# Patient Record
Sex: Female | Born: 2000 | Race: Black or African American | Hispanic: No | Marital: Single | Smoking: Never smoker
Health system: Southern US, Community
[De-identification: ages and names within clinical notes are randomized; demographics above are authoritative.]

## PROBLEM LIST (undated history)

## (undated) ENCOUNTER — Ambulatory Visit: Source: Home / Self Care

## (undated) DIAGNOSIS — G8929 Other chronic pain: Secondary | ICD-10-CM

## (undated) DIAGNOSIS — R519 Headache, unspecified: Secondary | ICD-10-CM

## (undated) DIAGNOSIS — Z789 Other specified health status: Secondary | ICD-10-CM

## (undated) HISTORY — DX: Headache, unspecified: R51.9

## (undated) HISTORY — PX: WISDOM TOOTH EXTRACTION: SHX21

## (undated) HISTORY — DX: Other specified health status: Z78.9

## (undated) HISTORY — DX: Other chronic pain: G89.29

---
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
## 2021-06-03 ENCOUNTER — Encounter (HOSPITAL_COMMUNITY): Payer: Self-pay

## 2021-06-03 ENCOUNTER — Emergency Department (HOSPITAL_COMMUNITY)
Admission: EM | Admit: 2021-06-03 | Discharge: 2021-06-04 | Disposition: A | Discharge status: Home / Self Care | Payer: Medicaid Other | Attending: Emergency Medicine | Admitting: Emergency Medicine

## 2021-06-03 ENCOUNTER — Other Ambulatory Visit: Payer: Self-pay

## 2021-06-03 DIAGNOSIS — O9229 Other disorders of breast associated with pregnancy and the puerperium: Secondary | ICD-10-CM | POA: Diagnosis present

## 2021-06-03 DIAGNOSIS — Z3A00 Weeks of gestation of pregnancy not specified: Secondary | ICD-10-CM | POA: Insufficient documentation

## 2021-06-03 DIAGNOSIS — N644 Mastodynia: Secondary | ICD-10-CM

## 2021-06-03 LAB — I-STAT BETA HCG BLOOD, ED (MC, WL, AP ONLY): I-stat hCG, quantitative: 236.1 m[IU]/mL — ABNORMAL HIGH (ref <5)

## 2021-06-03 MED ORDER — IBUPROFEN 800 MG PO TABS
800.0000 mg | ORAL_TABLET | Freq: Once | ORAL | Status: AC
  Administered 2021-06-03: 800 mg via ORAL
  Filled 2021-06-03: qty 1

## 2021-06-03 MED ORDER — CEPHALEXIN 500 MG PO CAPS
500.0000 mg | ORAL_CAPSULE | Freq: Once | ORAL | Status: AC
  Administered 2021-06-03: 500 mg via ORAL
  Filled 2021-06-03: qty 1

## 2021-06-03 MED ORDER — BACITRACIN ZINC 500 UNIT/GM EX OINT
TOPICAL_OINTMENT | Freq: Two times a day (BID) | CUTANEOUS | Status: DC
  Filled 2021-06-03: qty 3.6

## 2021-06-03 MED ORDER — CEPHALEXIN 500 MG PO CAPS: 500.0000 mg | ORAL_CAPSULE | Freq: Four times a day (QID) | ORAL | 0 refills | Status: AC

## 2021-06-03 NOTE — ED Triage Notes (Signed)
Patient states her right nipple was peeling and now her breast is swollen and tender to touch. No discharge.

## 2021-06-03 NOTE — ED Provider Notes (Signed)
[facility]-EMERGENCY DEPT Provider Note   CSN: [id number] Arrival date & time: 06/03/21  2134     History Chief Complaint  Patient presents with   Breast Pain    Ashley Dillon is a 20 y.o. female who presents with concern for 1 week of right nipple redness and gradual worsening of her pain.  There is an area that appeared like a blister but did not rupture or drain inferior lateral to the nipple, however there has been increased redness and pain of the nipple itself without discharge primarily worsened today with a burning sensation.  Patient is very tearful, states that her grandmother had breast cancer "when she was really old" and she is concerned that she may have breast cancer.  She is not currently breast-feeding and has never been pregnant, however she is concerned because she has missed her period x3 weeks and had a faint positive urine pregnancy test at home today.  She does states she also had a negative pregnancy test at home and would like to confirm with serologic testing.  I personally read this patient's medical records.  She does not carry medical diagnoses and is not on any medications every day.  No longer on oral birth control and is sexually active with a female partner.  HPI     History reviewed. No pertinent past medical history.  There are no problems to display for this patient.   Past Surgical History:  Procedure Laterality Date   WISDOM TOOTH EXTRACTION       OB History     Gravida  0   Para  0   Term  0   Preterm  0   AB  0   Living  0      SAB  0   IAB  0   Ectopic  0   Multiple  0   Live Births  0           History reviewed. No pertinent family history.  Social History   Tobacco Use   Smoking status: Never   Smokeless tobacco: Never  Substance Use Topics   Alcohol use: No   Drug use: Never    Home Medications Prior to Admission medications   Medication Sig Start Date End Date Taking?  Authorizing Provider  cephALEXin (KEFLEX) 500 MG capsule Take 1 capsule (500 mg total) by mouth 4 (four) times daily for 5 days. 06/03/21 06/08/21 Yes Karley Pho R, PA-C  cetirizine-pseudoephedrine (ZYRTEC-D) 5-120 MG tablet Take 1 tablet by mouth daily. Patient not taking: Reported on 09/05/2018 12/14/17   Belinda Fisher, PA-C  ciprofloxacin-dexamethasone (CIPRODEX) otic suspension Place 4 drops into the left ear 2 (two) times daily. Patient not taking: Reported on 09/05/2018 03/02/15   Charm Rings, MD  fluticasone [facility]) 50 MCG/ACT nasal spray Place 2 sprays into both nostrils daily. Patient not taking: Reported on 09/05/2018 12/14/17   Belinda Fisher, PA-C  Levonorgest-Eth Estrad-Fe Bisg (BALCOLTRA) 0.1-20 MG-MCG(21) TABS Take 1 tablet by mouth daily. 09/05/18   Constant, Peggy, MD  phenol (CHLORASEPTIC) 1.4 % LIQD Use as directed 1 spray in the mouth or throat as needed for throat irritation / pain. Patient not taking: Reported on 09/05/2018 12/14/17   Belinda Fisher, PA-C    Allergies    Patient has no known allergies.  Review of Systems   Review of Systems  Constitutional: Negative.   HENT: Negative.    Respiratory: Negative.    Cardiovascular: Negative.  Gastrointestinal: Negative.   Genitourinary:  Positive for menstrual problem.  Skin:  Positive for wound.   Physical Exam Updated Vital Signs BP 140/90 (BP Location: Left Arm)   Pulse 70   Temp 99 F (37.2 C) (Oral)   Resp 16   LMP 05/03/2021   SpO2 100%   Physical Exam Vitals and nursing note reviewed. Exam conducted with a chaperone present.  HENT:     Head: Normocephalic and atraumatic.     Mouth/Throat:     Mouth: Mucous membranes are moist.     Pharynx: No oropharyngeal exudate or posterior oropharyngeal erythema.  Eyes:     General:        Right eye: No discharge.        Left eye: No discharge.     Conjunctiva/sclera: Conjunctivae normal.  Cardiovascular:     Rate and Rhythm: Normal rate and regular rhythm.     Pulses:  Normal pulses.     Heart sounds: Normal heart sounds.  Pulmonary:     Effort: Pulmonary effort is normal. No respiratory distress.     Breath sounds: Normal breath sounds. No wheezing or rales.  Chest:     Chest wall: No mass, lacerations, deformity, swelling, tenderness or crepitus.  Breasts:    Right: Skin change and tenderness present. No inverted nipple.     Left: Normal.       Comments: Pendulous breasts Abdominal:     General: Bowel sounds are normal. There is no distension.     Palpations: Abdomen is soft.     Tenderness: There is no abdominal tenderness.  Musculoskeletal:        General: No deformity.     Cervical back: Neck supple.     Right lower leg: No edema.     Left lower leg: No edema.  Skin:    General: Skin is warm and dry.     Capillary Refill: Capillary refill takes less than 2 seconds.  Neurological:     General: No focal deficit present.     Mental Status: She is alert and oriented to person, place, and time. Mental status is at baseline.  Psychiatric:        Mood and Affect: Mood normal.    ED Results / Procedures / Treatments   Labs (all labs ordered are listed, but only abnormal results are displayed) Labs Reviewed  I-STAT BETA HCG BLOOD, ED ([facility], [facility], [facility] ONLY) - Abnormal; Notable for the following components:      Result Value   I-stat hCG, quantitative 236.1 (*)    All other components within normal limits    EKG None  Radiology No results found.  Procedures Procedures   Medications Ordered in ED Medications  bacitracin ointment ( Topical Provided for home use 06/04/21 0038)  cephALEXin (KEFLEX) capsule 500 mg (500 mg Oral Given 06/03/21 2336)  ibuprofen (ADVIL) tablet 800 mg (800 mg Oral Given 06/03/21 2336)    ED Course  I have reviewed the triage vital signs and the nursing notes.  Pertinent labs & imaging results that were available during my care of the patient were reviewed by me and considered in my medical decision making (see  chart for details).    MDM Rules/Calculators/A&P                         20 year old female presents with concern for pain and bleeding from the nipple x1 week.  No history of the same.  She is not currently breast-feeding.  Hypertensive on intake, vital signs otherwise normal.  Cardiopulmonary exam is normal abdominal exam is benign.  Breast exam did reveal macerated right nipple with bullous lesion of the breast adjacent to the nipple with tenderness palpation and surrounding erythema without induration or area of fluctuance to suggest abscess amenable to drainage.  Bedside ultrasound was utilized to evaluate the breast tissue which did not reveal any fluid collection.  Area was dressed with topical antibiotics and gauze to prevent friction with clothing.  First dose of antibiotics was administered in the emergency department for concern for possible cellulitis of the breast without abscess. Additionally patient did test positive for pregnancy on i-STAT hCG with hCG of 236.  Recommend close OB/GYN follow-up both for nipple lesion as well as positive pregnancy test for trending of hCG.  No further work-up warranted in the emergency department this time.  We will also refer to breast clinic.  Nithila voiced understanding of her medical evaluation and treatment plan.  Each of her questions was answered to her expressed satisfaction.  Return precautions given.  Patient is well-appearing, stable, and appropriate for discharge at this time.  This chart was dictated using voice recognition software, Dragon. Despite the best efforts of this provider to proofread and correct errors, errors may still occur which can change documentation meaning.  Final Clinical Impression(s) / ED Diagnoses Final diagnoses:  Nipple pain    Rx / DC Orders ED Discharge Orders          Ordered    Ambulatory referral to Breast Clinic       Comments: Left nipple maceration, erythema, and thickening x 1 week, no nipple  discharge. Not breastfeeding, + pregnancy test today.   06/04/21 0038    cephALEXin (KEFLEX) 500 MG capsule  4 times daily        06/03/21 2329             Malachi Suderman, Eugene Gavia, PA-C 06/04/21 0125    Virgina Norfolk, DO 06/04/21 2319

## 2021-06-03 NOTE — Discharge Instructions (Addendum)
You were seen in the ER today for your pain and bleeding from your nipple.  Your vital signs are reassuring.  While the exact cause of your breast pain remains unclear, there were concerns of potential infection of the skin irritable.  You have been started on antibiotic called cephalexin to take 4 times a day for the next 5 days.  Please call your OB/GYN first thing tomorrow morning to schedule follow-up, this week if possible.  Additionally below is contact information for the breast center with whom you have been referred; if you have not heard from them by the end of Thursday, please call their office to schedule follow-up.  Additionally, your pregnancy test was positive today. Please have your OBGYN follow up on your pregnancy hormone level (beta-HCG).  You may take Tylenol/ ibuprofen at home as needed for your pain.  May try cool compresses to the area as well.  Return to the emergency department felt any sudden worsening of your pain, new swelling, discharge from the nipple, fevers, chills, or any other new severe symptoms.

## 2021-08-07 ENCOUNTER — Encounter: Payer: Medicaid Other | Admitting: Obstetrics and Gynecology

## 2021-08-13 ENCOUNTER — Telehealth (INDEPENDENT_AMBULATORY_CARE_PROVIDER_SITE_OTHER): Payer: Medicaid Other

## 2021-08-13 DIAGNOSIS — Z136 Encounter for screening for cardiovascular disorders: Secondary | ICD-10-CM

## 2021-08-13 DIAGNOSIS — R635 Abnormal weight gain: Secondary | ICD-10-CM

## 2021-08-13 DIAGNOSIS — Z3480 Encounter for supervision of other normal pregnancy, unspecified trimester: Secondary | ICD-10-CM

## 2021-08-13 DIAGNOSIS — Z3A00 Weeks of gestation of pregnancy not specified: Secondary | ICD-10-CM

## 2021-08-13 DIAGNOSIS — O0990 Supervision of high risk pregnancy, unspecified, unspecified trimester: Secondary | ICD-10-CM | POA: Insufficient documentation

## 2021-08-13 DIAGNOSIS — Z3490 Encounter for supervision of normal pregnancy, unspecified, unspecified trimester: Secondary | ICD-10-CM

## 2021-08-13 MED ORDER — GOJJI WEIGHT SCALE MISC
1.0000 | Freq: Once | 0 refills | Status: AC
Start: 2021-08-13 — End: 2021-08-13

## 2021-08-13 MED ORDER — BLOOD PRESSURE MONITORING DEVI: 1.0000 | 0 refills | Status: DC

## 2021-08-13 NOTE — Patient Instructions (Signed)
AREA PEDIATRIC/FAMILY PRACTICE PHYSICIANS  [place] ([address]) [facility] Chambliss, MD; Eniola, MD; Hale, MD; Hensel, MD; McDiarmid, MD; McIntyer, MD; Svetlana Bagby, MD; Walden, MD [address] [place], [place] [address] [phone] Mon-Fri 8:30-12:30, 1:30-5:00 Providers come to see babies at [facility] Accepting Medicaid [facility] providers who accept newborns: Koirala, MD; Morrow, MD; Wolters, MD [address], [place], [place] [address] [phone] Mon-Fri 8:00-5:30 Babies seen by providers at [facility] Does NOT accept Medicaid Please call early in hospitalization for appointment (limited availability)  [facility] Mulberry, MD [address] [place], [place] [address] [phone] Mon, Tue, Thur, Fri 8:30-5:00, Wed 10:00-7:00 (closed 1-2pm) Babies seen by [facility] providers Accepting Medicaid Rubin - Pediatrician Rubin, MD [address], [place], [place] [address] [phone] Mon-Fri 8:30-5:00, Sat 8:30-12:00 Provider comes to see babies at [facility] Accepting Medicaid Must have been referred from current patients or contacted office prior to delivery [facility] ([facility]) Brown, MD; Chandler, MD; Ettefagh, MD; Grant, MD; Lester, MD; McCormick, MD; McQueen, MD; Prose, MD; Simha, MD; Stanley, MD; Stryffeler, NP; Tebben, NP [address], [place], [place] [address] [phone] Mon, Tue, Thur, Fri 8:30-5:30, Wed 9:30-5:30, Sat 8:30-12:30 Babies seen by [facility] providers Accepting Medicaid Only accepting infants of first-time parents or siblings of current patients Hospital discharge coordinator will make follow-up appointment Jack Amos [address], [place], [place]  [address] [phone]   Fax - [phone] [facility] [address], [place], [place]  [address] Phone - [phone]   Fax - [phone] Shilpa Gosrani [address], [place], [place]  [address] [phone]  [place] ([address]) [facility] Bates, MD; Brassfield, MD; Cooper, Cox, MD; MD; Davis, MD; Dovico, MD; Ettefaugh, MD; Little, MD; Lowe, MD; Keiffer, MD; Melvin, MD; Sumner, MD; Williams, MD [address], [place], [place] [address] [phone] Mon-Fri 8:30-5:00 (extended evenings Mon-Thur as needed), Sat-Sun 10:00-1:00 Providers come to see babies at [facility] Accepting Medicaid for families of first-time babies and families with all children in the household age 3 and under. Must register with office prior to making appointment (M-F only). [facility] Henson, NP; Knapp, MD; Lalonde, MD; Tysinger, PA [address] [place], [place] [address] [phone] Mon-Fri 8:00-5:00 Babies seen by providers at [facility] Does NOT accept Medicaid/Commercial Insurance Only [facility] ([facility])  Artis, MD; Barnes, MD; Bratton, MD; Coccaro, MD; Lockett Gardner, MD; Kramer, MD; Marshall, MD; Netherton, MD; Poleto, MD; Skinner, MD [address] [place], [place] [address] [phone] Mon-Fri 8:30-5:30, Sat (Oct.-Mar.) 9:00-1:00 Babies seen by providers at [facility] Accepting Medicaid  [place] ([address]) [facility] Reid, MD; Warner, MD [address], [place], [place] [address] [phone] Mon-Fri 8:30-5:00, Sat 8:30-12:00 Providers come to see babies at [facility] Does NOT accept Medicaid [facility] Becker, PA; Hagler, MD; Scifres, PA; Sun, MD; Swayne, MD [address], [place],  [address] [phone] Mon-Fri 8:00-5:00 Babies seen by providers at [facility] Does NOT accept Medicaid Only accepting babies of parents who  are patients Please call early in hospitalization for appointment (limited availability)  Pediatricians Clark, MD; Frye, MD; Kelleher, MD; Mack, NP; Miller, MD; O'Keller, MD; Patterson, NP; Pudlo, MD; Puzio, MD; Thomas, MD; Tucker, MD; Twiselton, MD [address], [place], [place] [address] [phone] Mon-Fri 8:00-5:00, Sat 9:00-12:00 Providers come to see babies at [facility] Does NOT accept Medicaid  [place] ([address]) [facility] providers accepting new patients: Brake, NP; [place], PA [address], [place], [place] [address] [phone] Mon-Fri 8:00-5:00 Babies seen by providers at [facility] Does NOT accept Medicaid Only accepting babies of parents who are patients Please call early in hospitalization for appointment (limited availability) [facility] Gay, MD; Quinlan, MD [address] [place], [place] [address] [phone] (press [phone] to schedule appointment) Mon-Fri 8:00-5:00 Providers come to see babies at [facility] Does NOT accept Medicaid [facility] Mazer, MD [address] [place], [place] [address] [phone] Mon-Fri 8:30-5:00 (lunch 12:30-1:00), extended hours by appointment only Wed 5:00-6:30 Babies seen by [facility] providers Accepting Medicaid [facility] [place], MD; Jordan, MD; Koberlein, MD [address], [place], [place] [address] [phone] Mon-Fri 8:00-5:00 Babies seen by [facility] providers Does NOT accept Medicaid [facility] Parker, MD; Hunter, MD; Wallace, DO [address] [place], [place] [address] [phone] Mon-Fri 8:00-5:00 Babies seen by [facility] providers Does NOT accept Medicaid [facility] Brandon, PA; Brecken, PA; Christy, NP; Dees, MD; DeClaire, MD; DeWeese, MD; Hansen, NP; Mills, NP; Parrish, NP; Smoot, NP; Summer, MD; Vapne,  MD [address] [place], [place] [address] [phone] Mon-Fri 8:30-5:00, Sat 10:00-1:00 Providers come to see babies at [facility] Does NOT accept Medicaid Free prenatal information session Tuesdays at 4:45pm [facility] Bouska, MD; Gordon, PA; Jeffery, PA; Weber, PA [address] [place] [address] [phone] Mon-Fri 7:30-5:30 Babies seen by [facility] providers [facility] [address], [place], [place]  [address] [phone]   Fax - [phone]  [place] ([address] & [address]) [facility] [place] [address] [place], [place] [address] [phone] Mon-Thur 8:00-6:00 Providers come to see babies at [facility] Accepting Medicaid [facility] Anderson, NP; Badger, MD; Beal, PA; Spencer, PA [address] [place], [place] [address] [phone] Mon-Thur 7:30-7:30, Fri 7:30-4:30 Babies seen by [facility] providers Accepting Medicaid [facility] Agbuya, MD; Klett, NP; Romgoolam, MD [address], [place], [place] [address] [phone] Mon-Fri 8:30-5:00, Sat 8:30-12:00 Providers come to see babies at [facility] Accepting Medicaid Must have "Meet & Greet" appointment at office prior to delivery [facility] McCord, MD; Wallace, MD; Wood, MD [address], [place], [place] [address] [phone] Mon-Wed 8:00-6:00, Thur-Fri 8:00-5:00, Sat 9:00-12:00 Providers come to see babies at [facility] Does NOT accept Medicaid Only accepting siblings of current patients [facility]  [address], [place], [place]  [address] [phone]   Fax - [phone] [facility] [address], [place], [place]  [address] [phone]   Fax -  [phone]  [place] ([address] & [address]) [facility] Cirigliano, DO; Matthews, DO [address] [place], [place] [address] [phone] Mon-Fri 7:00-5:00 Babies seen by [facility] providers Does NOT accept Medicaid [facility] Briscoe, MD; Howley, PA; Moreira, PA [address], [place],  [address] [phone] Mon-Fri 8:00-5:00 Babies seen by [facility] providers Accepting Medicaid [facility] Boyd, MD; Church, PA; Jones, NP; Osborn, PA [address], ,  [address] [phone] Mon-Fri 8:00-5:00 Babies seen by providers at [facility] Accepting Medicaid  [place] ([address]) [facility] [place] Wendling, DO [address] [place], [place] [address] [phone] Mon-Fri 8:00-5:00 Babies seen by [facility] providers Does NOT accept Medicaid Limited availability, please call early in hospitalization to schedule follow-up [facility] Calderon, PA; Cummings, MD; Dillard, MD; Martin, PA; Olson, MD; VanDeven, PA [address], [place], [place] [address] [phone] Mon-Fri 8:30-5:00, Sat 9:00-12:00 Babies seen by providers at [facility] Accepting Medicaid Please register online then schedule online or call office [email or web address] Hunter, NP; Kumar, MD; Martin Rogers, PA [address], [place], [place] [address] [phone] Mon-Fri 8:00-5:00 Babies seen by providers at [facility] Accepting Medicaid [facility] ([facility]) [place], MD; Kristi Fleenor, NP; West, MD [address], [place], [place] [address] [phone] Mon-Fri 8:00-5:30, Sat&Sun by appointment (phones open at  8:30) Babies seen by [facility] providers Accepting Medicaid Must be a first-time baby or sibling of current patient [facility] [place]  [address], [place], [place]  [address] [phone]   Fax - [phone]  [place] ([address]) [facility] Brown, PA; Cowen, PA; Rice, MD; Helton, PA; Spry, MD [address] [place], [place] [address] [phone] Mon-Thur 8:00-7:00, Fri 8:00-5:00, Sat 8:00-12:00, Sun 9:00-12:00 Babies seen by [facility] providers Accepting Medicaid [facility] Coe-Goins, MD; Marshall, MD; Pierre-Louis, MD [address], [place], [place] [address] [phone] Mon-Thur 8:00-5:00 Babies seen by providers at [facility] Accepting Medicaid [facility] Bratton, MD; Coe-Goins, MD; Hayes, MD; Lewis, MD; List, MD; Lott, MD; Marshall, MD; Moran, MD; O'Kawthar Ennen, MD; Pierre-Louis, MD; Pitonzo, MD; Scholer, MD; Spangle, MD [address] [place], [place] [address] [phone] Mon-Fri 8:00-5:30, Sat (Oct.-Mar.) 9:00-1:00 Babies seen by providers at [facility] Accepting Medicaid Must fill out new patient packet, available online at [email or web address] [facility] ([facility]) Friddle, NP; Harris, NP; Kelly, NP; Logan, MD; Melvin, PA; Poth, MD; Ramadoss, MD; Stanton, NP [address], [place], [place] [address] [phone] Mon-Thur 8:00-5:30, Fri 8:00-5:00 Babies seen by providers at [facility] Accepting Medicaid  [place] ([address]) [facility] Dixon, PA; [facility], MD; Pickard, MD; Tapia, PA [address], [place], [place] [address] [phone] Mon-Fri 8:00-5:00 Babies seen by providers at [facility] Accepting Medicaid   [place] ([address]) [facility] [place] Masneri, DO; Meyers, MD; Nelson, PA [address], [place], [place] [address] [phone] Mon-Fri 8:00-5:00 Babies seen by providers at [facility] Does NOT accept Medicaid Limited appointment availability, please call early in hospitalization  [facility] [place] Kunedd, DO; McGowen, MD [address], [place], [place] [address] [phone] Mon-Fri 8:00-5:00 Babies seen by [facility] providers Does NOT accept Medicaid [facility] [place] Cameron, MD; MacDonald, MD; Michaels, PA; Nayak, MD [address] [place] [address], [place], [place] [address] [phone] Mon-Fri 8:00-5:00 After hours clinic ([address] [place], [place] [address]) [phone] Mon-Fri 5:00-8:00, Sat 12:00-6:00, Sun 10:00-4:00 Babies seen by [facility] providers Accepting Medicaid [facility] [place] [address], [place], [place]  [address] [phone]   Fax - [phone]  [place] ([address]) [facility] [place] [facility] Andy, MD [address], [place], [place] [address] [phone] Mon-Fri 8:00-5:00 Babies seen by [facility] providers Does NOT accept Medicaid [facility] [place] ([facility] [place]) Eksir, MD [address], [place], [place] [address] [phone] Mon-Thur 8:00-7:00, Fri 8:00-5:00, Sat 8:00-12:00 Babies seen by providers at [facility] Accepting Medicaid - but does not have vaccinations in office (must be received elsewhere) Limited availability, please call early in hospitalization  [place] ([address]) [facility]  Charlene Flemming, MD [address], [place] [address] [phone]  Fax [phone]  [facility]  Kimberly Newton, MD, Annamarie Streilein, PA, Carla Hampton, PA [address] [place], [place]  [address] [phone] [facility]  [address], [place], [place] [address] [phone] [address], [place], [place] [address] [phone] (West Office)  [facility] [address], [place], [place] [address] [phone] [facility] [address] [place], [place], [place] [address] [phone] [facility] [address], [place], [place] [address] [phone] [facility] [address], [place], [place] [address] [phone] [facility] [address], [place], [place] [address] [phone] [facility] [address], [place], [place] [address] [phone] [facility]  [address], [place], [place] [address] [phone] Dr[facility] [address] [place], [place], [place] [address] [phone] [facility] [address], [place], [place] [address] [phone] [facility] [address], [place],  [address] [phone]  

## 2021-08-13 NOTE — Progress Notes (Signed)
New OB Intake  I connected with  Ashley Dillon on 08/13/21 at  3:15 PM EDT by MyChart Video Visit and verified that I am speaking with the correct person using two identifiers. Nurse is located at [facility] and pt is located at HOME.  I discussed the limitations, risks, security and privacy concerns of performing an evaluation and management service by telephone and the availability of in person appointments. I also discussed with the patient that there may be a patient responsible charge related to this service. The patient expressed understanding and agreed to proceed.  I explained I am completing New OB Intake today. We discussed her EDD of 01/16/22 that is based on LMP of 04/11/21. Pt is G1/P0. I reviewed her allergies, medications, Medical/Surgical/OB history, and appropriate screenings. I informed her of [facility] services. Based on history, this is a/an  pregnancy uncomplicated .   There are no problems to display for this patient.   Concerns addressed today  Delivery Plans:  Plans to deliver at [facility].   MyChart/Babyscripts MyChart access verified. I explained pt will have some visits in office and some virtually. Babyscripts instructions given and order placed. Patient verifies receipt of registration text/e-mail. Account successfully created and app downloaded.  Blood Pressure Cuff  Blood pressure cuff ordered for patient to pick-up from [employer]. Explained after first prenatal appt pt will check weekly and document in Babyscripts.  Weight scale: Patient    have weight scale. Weight scale ordered for patient to pick up form [facility].   Anatomy US Explained first scheduled [place] will be around 19 weeks. Anatomy US scheduled for 09/03/21 at 1:15p. Pt notified to arrive at 1:00p.  Labs Discussed Avelina Laine genetic screening with patient. Would like both Panorama and Horizon drawn at new OB visit. Routine prenatal labs needed.  Covid Vaccine Patient has not covid vaccine.    Mother/ Baby Dyad Candidate?    If yes, offer as possibility  Informed patient of [facility] website  and placed link in her AVS.   Social Determinants of Health Food Insecurity: Patient denies food insecurity. WIC Referral: Patient is interested in referral to [facility].  Transportation: Patient denies transportation needs. Childcare: Discussed no children allowed at ultrasound appointments. Offered childcare services; patient declines childcare services at this time.  Send link to Pregnancy Navigators   Placed OB Box on problem list and updated  First visit review I reviewed new OB appt with pt. I explained she will have a pelvic exam, ob bloodwork with genetic screening, and PAP smear. Explained pt will be seen by Camelia Eng at first visit; encounter routed to appropriate provider. Explained that patient will be seen by pregnancy navigator following visit with provider. [facility] information placed in AVS.   Henrietta Dine, CMA 08/13/2021  3:25 PM

## 2021-08-13 NOTE — Progress Notes (Signed)
Pt went to ED on 06/03/21 for Nipple Pain , advised was painful & raw to touch. Pt was given antibiotic cream & Rx Kelfex. Pt states has been using cream but never picked up the pills. Still having clear d/c, wants to know if that's normal.Advised Provider will address at visit.

## 2021-08-26 ENCOUNTER — Other Ambulatory Visit: Payer: Self-pay

## 2021-08-26 ENCOUNTER — Ambulatory Visit (INDEPENDENT_AMBULATORY_CARE_PROVIDER_SITE_OTHER): Payer: Medicaid Other | Admitting: Family Medicine

## 2021-08-26 VITALS — BP 120/81 | HR 101 | Wt 290.8 lb

## 2021-08-26 DIAGNOSIS — Z3480 Encounter for supervision of other normal pregnancy, unspecified trimester: Secondary | ICD-10-CM | POA: Diagnosis not present

## 2021-08-26 NOTE — Progress Notes (Signed)
Subjective:   Ashley Dillon is a 20 y.o. G1P0000 at [redacted]w[redacted]d by LMP being seen today for her first obstetrical visit.  Her obstetrical history is significant for obesity. Patient does intend to breast feed. Pregnancy history fully reviewed.  Patient reports no complaints.  HISTORY: OB History  Gravida Para Term Preterm AB Living  1 0 0 0 0 0  SAB IAB Ectopic Multiple Live Births  0 0 0 0 0    # Outcome Date GA Lbr Len/2nd Weight Sex Delivery Anes PTL Lv  1 Current              Last pap smear: No results found for: DIAGPAP, HPV, HPVHIGH N/a  Past Medical History:  Diagnosis Date   Medical history non-contributory    Past Surgical History:  Procedure Laterality Date   WISDOM TOOTH EXTRACTION     Family History  Problem Relation Age of Onset   Diabetes Mother    Cancer Paternal Grandmother    Social History   Tobacco Use   Smoking status: Never   Smokeless tobacco: Never  Substance Use Topics   Alcohol use: No   Drug use: Never   No Known Allergies Current Outpatient Medications on File Prior to Visit  Medication Sig Dispense Refill   Blood Pressure Monitoring DEVI 1 each by Does not apply route once a week. 1 each 0   cetirizine-pseudoephedrine (ZYRTEC-D) 5-120 MG tablet Take 1 tablet by mouth daily. 15 tablet 0   Prenatal Vit-Fe Fumarate-FA (MULTIVITAMIN-PRENATAL) 27-0.8 MG TABS tablet Take 1 tablet by mouth daily at 12 noon.     ciprofloxacin-dexamethasone (CIPRODEX) otic suspension Place 4 drops into the left ear 2 (two) times daily. (Patient not taking: No sig reported) 7.5 mL 0   fluticasone (FLONASE) 50 MCG/ACT nasal spray Place 2 sprays into both nostrils daily. (Patient not taking: No sig reported) 1 g 0   Levonorgest-Eth Estrad-Fe Bisg (BALCOLTRA) 0.1-20 MG-MCG(21) TABS Take 1 tablet by mouth daily. (Patient not taking: No sig reported) 84 tablet 4   phenol (CHLORASEPTIC) 1.4 % LIQD Use as directed 1 spray in the mouth or throat as needed for throat  irritation / pain. (Patient not taking: No sig reported) 1 Bottle 0   No current facility-administered medications on file prior to visit.     Exam   Vitals:   08/26/21 1552  BP: 120/81  Pulse: (!) 101  Weight: 290 lb 12.8 oz (131.9 kg)   Fetal Heart Rate (bpm): 148  System: General: well-developed, well-nourished female in no acute distress   Skin: normal coloration and turgor, no rashes   Neurologic: oriented, normal, negative, normal mood   Extremities: normal strength, tone, and muscle mass, ROM of all joints is normal   HEENT PERRLA, extraocular movement intact and sclera clear, anicteric   Neck supple and no masses   Respiratory:  no respiratory distress      Assessment:   Pregnancy: G1P0000 Patient Active Problem List   Diagnosis Date Noted   Supervision of other normal pregnancy, antepartum 08/13/2021     Plan:  1. Supervision of other normal pregnancy, antepartum Initial labs drawn. Continue prenatal vitamins. Genetic Screening discussed, NIPS: ordered. Ultrasound discussed; fetal anatomic survey: ordered. Problem list reviewed and updated. The nature of Dyad/Family Care clinic was explained to patient; Voiced they may need to be seen by other [facility] providers which includes family medicine physicians, OB GYNs, and APPs. Delivery will hopefully be with one of the Dyad providers  or another Family Medicine physician and we cannot promise this at this time.  Discussed there are [facility] staff in the hospital 24-7 and they understand and support this model and there is a likelihood one of these providers will catch their baby.  We also discussed that the service includes learners (residents, student) and they will be involved in the care team.  - Hemoglobin A1c - CBC/D/Plt+RPR+Rh+ABO+RubIgG... - Genetic Screening - Culture, OB Urine - HIV Antibody (routine testing w rflx) - CHL AMB BABYSCRIPTS SCHEDULE OPTIMIZATION   Routine obstetric precautions reviewed. Return in  about 4 weeks (around 09/23/2021) for Dyad patient, ob visit.

## 2021-08-27 LAB — HCV INTERPRETATION

## 2021-08-27 LAB — CBC/D/PLT+RPR+RH+ABO+RUBIGG...
Antibody Screen: NEGATIVE
Basophils Absolute: 0 10*3/uL (ref 0.0–0.2)
Basos: 0 %
EOS (ABSOLUTE): 0.2 10*3/uL (ref 0.0–0.4)
Eos: 2 %
HCV Ab: <0.1 s/co ratio (ref 0.0–0.9)
HIV Screen 4th Generation wRfx: NONREACTIVE
Hematocrit: 36.4 % (ref 34.0–46.6)
Hemoglobin: 12 g/dL (ref 11.1–15.9)
Hepatitis B Surface Ag: NEGATIVE
Immature Grans (Abs): 0.1 10*3/uL (ref 0.0–0.1)
Immature Granulocytes: 1 %
Lymphocytes Absolute: 1.6 10*3/uL (ref 0.7–3.1)
Lymphs: 17 %
MCH: 31.3 pg (ref 26.6–33.0)
MCHC: 33 g/dL (ref 31.5–35.7)
MCV: 95 fL (ref 79–97)
Monocytes Absolute: 0.6 10*3/uL (ref 0.1–0.9)
Monocytes: 7 %
Neutrophils Absolute: 7.1 10*3/uL — ABNORMAL HIGH (ref 1.4–7.0)
Neutrophils: 73 %
Platelets: 258 10*3/uL (ref 150–450)
RBC: 3.84 x10E6/uL (ref 3.77–5.28)
RDW: 11.3 % — ABNORMAL LOW (ref 11.7–15.4)
RPR Ser Ql: NONREACTIVE
Rh Factor: POSITIVE
Rubella Antibodies, IGG: 1.69 index (ref >0.99)
WBC: 9.5 10*3/uL (ref 3.4–10.8)

## 2021-08-27 LAB — HEMOGLOBIN A1C
Est. average glucose Bld gHb Est-mCnc: 80 mg/dL
Hgb A1c MFr Bld: 4.4 % — ABNORMAL LOW (ref 4.8–5.6)

## 2021-08-28 LAB — URINE CULTURE, OB REFLEX

## 2021-08-28 LAB — CULTURE, OB URINE

## 2021-09-03 ENCOUNTER — Ambulatory Visit (HOSPITAL_BASED_OUTPATIENT_CLINIC_OR_DEPARTMENT_OTHER): Payer: Medicaid Other | Admitting: Maternal & Fetal Medicine

## 2021-09-03 ENCOUNTER — Ambulatory Visit: Payer: Medicaid Other

## 2021-09-03 ENCOUNTER — Other Ambulatory Visit: Payer: Self-pay

## 2021-09-03 DIAGNOSIS — Z3480 Encounter for supervision of other normal pregnancy, unspecified trimester: Secondary | ICD-10-CM | POA: Diagnosis present

## 2021-09-03 NOTE — Progress Notes (Signed)
MFM Brief Note  Ashley Dillon is a 20 yo G1P0 at 53 w 4d who is here for a detailed exam  Single intrauterine pregnancy here for a detailed anatomy due to maternal elevated BMI. Normal anatomy with measurements inconsistent with dates, therefore we have adjusted [name] EDD to today's examination.  There is good fetal movement and amniotic fluid volume Suboptimal views of the fetal anatomy were obtained secondary to fetal position.  Today we observed a marginal cord insertion that was 1.8 cm from the placental edge. I reviewed with Ashley Dillon that a marginal cord insertion can be associated with small for gestational age. Therefore we have scheduled her to return in 4 weeks.   Given Ashley Dillon elevated BMI we recommend serial growth every 4-6 weeks and to initiate weekly testing between 34-36 weeks. Total weight gain should range between 11-20 lb.  I spent 30 minutes with > 50% in face to face consultation.  Novella Olive, MD.

## 2021-09-04 ENCOUNTER — Other Ambulatory Visit: Payer: Self-pay | Admitting: *Deleted

## 2021-09-04 DIAGNOSIS — O43199 Other malformation of placenta, unspecified trimester: Secondary | ICD-10-CM

## 2021-09-04 DIAGNOSIS — Z3492 Encounter for supervision of normal pregnancy, unspecified, second trimester: Secondary | ICD-10-CM

## 2021-09-04 DIAGNOSIS — R638 Other symptoms and signs concerning food and fluid intake: Secondary | ICD-10-CM

## 2021-09-06 ENCOUNTER — Encounter: Payer: Self-pay | Admitting: Radiology

## 2021-09-09 ENCOUNTER — Telehealth: Payer: Self-pay | Admitting: Lactation Services

## 2021-09-09 NOTE — Telephone Encounter (Signed)
Called patient to inform her that Genetic Screening results could not be ran as insufficient fetal DNA. Patient was not available and was asked to call patient back.   My Chart Message sent.

## 2021-09-23 ENCOUNTER — Encounter: Payer: Self-pay | Admitting: General Practice

## 2021-10-01 ENCOUNTER — Other Ambulatory Visit: Payer: Self-pay

## 2021-10-01 ENCOUNTER — Encounter: Payer: Self-pay | Admitting: *Deleted

## 2021-10-01 ENCOUNTER — Ambulatory Visit: Payer: Medicaid Other | Admitting: *Deleted

## 2021-10-01 ENCOUNTER — Ambulatory Visit: Payer: Medicaid Other | Attending: Maternal & Fetal Medicine

## 2021-10-01 VITALS — BP 119/48 | HR 80

## 2021-10-01 DIAGNOSIS — Z3A21 21 weeks gestation of pregnancy: Secondary | ICD-10-CM

## 2021-10-01 DIAGNOSIS — E669 Obesity, unspecified: Secondary | ICD-10-CM | POA: Diagnosis not present

## 2021-10-01 DIAGNOSIS — O43192 Other malformation of placenta, second trimester: Secondary | ICD-10-CM

## 2021-10-01 DIAGNOSIS — R638 Other symptoms and signs concerning food and fluid intake: Secondary | ICD-10-CM | POA: Diagnosis present

## 2021-10-01 DIAGNOSIS — O99212 Obesity complicating pregnancy, second trimester: Secondary | ICD-10-CM

## 2021-10-01 DIAGNOSIS — O43199 Other malformation of placenta, unspecified trimester: Secondary | ICD-10-CM | POA: Diagnosis present

## 2021-10-01 DIAGNOSIS — Z3480 Encounter for supervision of other normal pregnancy, unspecified trimester: Secondary | ICD-10-CM

## 2021-10-01 DIAGNOSIS — Z3492 Encounter for supervision of normal pregnancy, unspecified, second trimester: Secondary | ICD-10-CM | POA: Diagnosis not present

## 2021-10-02 ENCOUNTER — Other Ambulatory Visit: Payer: Self-pay | Admitting: *Deleted

## 2021-10-02 DIAGNOSIS — Z6841 Body Mass Index (BMI) 40.0 and over, adult: Secondary | ICD-10-CM

## 2021-10-15 ENCOUNTER — Ambulatory Visit (INDEPENDENT_AMBULATORY_CARE_PROVIDER_SITE_OTHER): Payer: Medicaid Other | Admitting: Family Medicine

## 2021-10-15 ENCOUNTER — Other Ambulatory Visit: Payer: Self-pay

## 2021-10-15 VITALS — BP 123/71 | HR 80 | Ht 63.0 in | Wt 323.2 lb

## 2021-10-15 DIAGNOSIS — Z3480 Encounter for supervision of other normal pregnancy, unspecified trimester: Secondary | ICD-10-CM

## 2021-10-15 NOTE — Progress Notes (Signed)
   Subjective:  Ashley Dillon is a 20 y.o. G1P0000 at [redacted]w[redacted]d being seen today for ongoing prenatal care.  She is currently monitored for the following issues for this low-risk pregnancy and has Supervision of other normal pregnancy, antepartum on their problem list.  Patient reports no complaints.  Contractions: Not present. Vag. Bleeding: None.  Movement: Present. Denies leaking of fluid.   The following portions of the patient's history were reviewed and updated as appropriate: allergies, current medications, past family history, past medical history, past social history, past surgical history and problem list. Problem list updated.  Objective:   Vitals:   10/15/21 1609  BP: 123/71  Pulse: 80  Weight: (!) 323 lb 3.2 oz (146.6 kg)  Height: 5\' 3"  (1.6 m)    Fetal Status: Fetal Heart Rate (bpm): 147   Movement: Present     General:  Alert, oriented and cooperative. Patient is in no acute distress.  Skin: Skin is warm and dry. No rash noted.   Cardiovascular: Normal heart rate noted  Respiratory: Normal respiratory effort, no problems with respiration noted  Abdomen: Soft, gravid, appropriate for gestational age. Pain/Pressure: Absent     Pelvic: Vag. Bleeding: None     Cervical exam deferred        Extremities: Normal range of motion.  Edema: None  Mental Status: Normal mood and affect. Normal behavior. Normal judgment and thought content.   Urinalysis:      Assessment and Plan:  Pregnancy: G1P0000 at [redacted]w[redacted]d  1. Supervision of other normal pregnancy, antepartum BP and FHR normal Declines repeat genetic testing Discussed fasting labs for next visit  Preterm labor symptoms and general obstetric precautions including but not limited to vaginal bleeding, contractions, leaking of fluid and fetal movement were reviewed in detail with the patient. Please refer to After Visit Summary for other counseling recommendations.  Return in 4 weeks (on 11/12/2021) for Dyad patient, ob visit,  28 wk labs.   14/06/2021, MD

## 2021-10-15 NOTE — Patient Instructions (Signed)

## 2021-10-29 ENCOUNTER — Other Ambulatory Visit: Payer: Self-pay

## 2021-10-29 ENCOUNTER — Encounter: Payer: Self-pay | Admitting: *Deleted

## 2021-10-29 ENCOUNTER — Other Ambulatory Visit: Payer: Self-pay | Admitting: *Deleted

## 2021-10-29 ENCOUNTER — Ambulatory Visit: Payer: Medicaid Other | Admitting: *Deleted

## 2021-10-29 ENCOUNTER — Ambulatory Visit: Payer: Medicaid Other | Attending: Obstetrics and Gynecology

## 2021-10-29 VITALS — BP 117/56 | HR 86

## 2021-10-29 DIAGNOSIS — Z6841 Body Mass Index (BMI) 40.0 and over, adult: Secondary | ICD-10-CM | POA: Diagnosis present

## 2021-10-29 DIAGNOSIS — Z3480 Encounter for supervision of other normal pregnancy, unspecified trimester: Secondary | ICD-10-CM

## 2021-10-29 DIAGNOSIS — Z3A25 25 weeks gestation of pregnancy: Secondary | ICD-10-CM | POA: Insufficient documentation

## 2021-10-29 DIAGNOSIS — O99212 Obesity complicating pregnancy, second trimester: Secondary | ICD-10-CM | POA: Diagnosis not present

## 2021-10-29 DIAGNOSIS — Z362 Encounter for other antenatal screening follow-up: Secondary | ICD-10-CM | POA: Insufficient documentation

## 2021-10-29 DIAGNOSIS — O43192 Other malformation of placenta, second trimester: Secondary | ICD-10-CM | POA: Diagnosis not present

## 2021-11-15 ENCOUNTER — Encounter: Payer: Self-pay | Admitting: Family Medicine

## 2021-11-17 ENCOUNTER — Other Ambulatory Visit: Payer: Self-pay | Admitting: *Deleted

## 2021-11-17 DIAGNOSIS — Z3480 Encounter for supervision of other normal pregnancy, unspecified trimester: Secondary | ICD-10-CM

## 2021-11-19 ENCOUNTER — Other Ambulatory Visit: Payer: Medicaid Other

## 2021-11-19 ENCOUNTER — Ambulatory Visit (INDEPENDENT_AMBULATORY_CARE_PROVIDER_SITE_OTHER): Payer: Medicaid Other | Admitting: Family Medicine

## 2021-11-19 ENCOUNTER — Other Ambulatory Visit: Payer: Self-pay

## 2021-11-19 DIAGNOSIS — O43199 Other malformation of placenta, unspecified trimester: Secondary | ICD-10-CM

## 2021-11-19 DIAGNOSIS — Z3480 Encounter for supervision of other normal pregnancy, unspecified trimester: Secondary | ICD-10-CM

## 2021-11-19 DIAGNOSIS — Z23 Encounter for immunization: Secondary | ICD-10-CM | POA: Diagnosis not present

## 2021-11-19 NOTE — Progress Notes (Signed)
° °  Subjective:  Ashley Dillon is a 20 y.o. G1P0000 at [redacted]w[redacted]d being seen today for ongoing prenatal care.  She is currently monitored for the following issues for this low-risk pregnancy and has Supervision of other normal pregnancy, antepartum and Marginal insertion of umbilical cord affecting management of mother on their problem list.  Patient reports no complaints.  Contractions: Not present. Vag. Bleeding: None.  Movement: Present. Denies leaking of fluid.   The following portions of the patient's history were reviewed and updated as appropriate: allergies, current medications, past family history, past medical history, past social history, past surgical history and problem list. Problem list updated.  Objective:   Vitals:   11/19/21 0936  BP: 125/79  Pulse: 98  Weight: (!) 337 lb (152.9 kg)    Fetal Status: Fetal Heart Rate (bpm): 135   Movement: Present     General:  Alert, oriented and cooperative. Patient is in no acute distress.  Skin: Skin is warm and dry. No rash noted.   Cardiovascular: Normal heart rate noted  Respiratory: Normal respiratory effort, no problems with respiration noted  Abdomen: Soft, gravid, appropriate for gestational age. Pain/Pressure: Absent     Pelvic: Vag. Bleeding: None     Cervical exam deferred        Extremities: Normal range of motion.  Edema: None  Mental Status: Normal mood and affect. Normal behavior. Normal judgment and thought content.   Urinalysis:      Assessment and Plan:  Pregnancy: G1P0000 at [redacted]w[redacted]d  1. Supervision of other normal pregnancy, antepartum BP and FHR normal Third trimester labs today Accepts TDaP Discussed contraception, will do post placental hormonal IUD  2. Marginal insertion of umbilical cord affecting management of mother Following w MFM, last growth EFW 28%  Preterm labor symptoms and general obstetric precautions including but not limited to vaginal bleeding, contractions, leaking of fluid and fetal  movement were reviewed in detail with the patient. Please refer to After Visit Summary for other counseling recommendations.  Return in 2 weeks (on 12/03/2021) for Dyad patient, ob visit.   Venora Maples, MD

## 2021-11-19 NOTE — Patient Instructions (Signed)

## 2021-11-20 LAB — CBC
Hematocrit: 31.4 % — ABNORMAL LOW (ref 34.0–46.6)
Hemoglobin: 10.4 g/dL — ABNORMAL LOW (ref 11.1–15.9)
MCH: 31 pg (ref 26.6–33.0)
MCHC: 33.1 g/dL (ref 31.5–35.7)
MCV: 94 fL (ref 79–97)
Platelets: 230 10*3/uL (ref 150–450)
RBC: 3.35 x10E6/uL — ABNORMAL LOW (ref 3.77–5.28)
RDW: 11 % — ABNORMAL LOW (ref 11.7–15.4)
WBC: 13.6 10*3/uL — ABNORMAL HIGH (ref 3.4–10.8)

## 2021-11-20 LAB — RPR: RPR Ser Ql: NONREACTIVE

## 2021-11-20 LAB — GLUCOSE TOLERANCE, 2 HOURS W/ 1HR
Glucose, 1 hour: 98 mg/dL (ref 70–179)
Glucose, 2 hour: 101 mg/dL (ref 70–152)
Glucose, Fasting: 85 mg/dL (ref 70–91)

## 2021-11-20 LAB — HIV ANTIBODY (ROUTINE TESTING W REFLEX): HIV Screen 4th Generation wRfx: NONREACTIVE

## 2021-11-24 ENCOUNTER — Telehealth: Payer: Self-pay

## 2021-11-24 NOTE — Telephone Encounter (Signed)
Call placed to pt. Pt states having headaches, nausea, lightheaded and eye pain yesterday but has improved today with just eye pain and headache..Pt denies taking any OTC medications, just hot lemon water. Did not resolve symptoms. Pt states made eye dr appt for 12/20. Pt states does not have a blood pressure cuff at home. Pt advised to come to office today for BP check. Pt states " has to go to work and cant come today" Pt advised to take 2 EX Tylenol and eat and drink today and come tomorrow for BP check. Pt states " I'll try" pt scheduled for nurse visit at 1040am for BP check.  Pt denies blurry vision, nausea today. +FM.   Judeth Cornfield, RN

## 2021-11-25 ENCOUNTER — Ambulatory Visit (INDEPENDENT_AMBULATORY_CARE_PROVIDER_SITE_OTHER): Payer: Medicaid Other

## 2021-11-25 ENCOUNTER — Other Ambulatory Visit: Payer: Self-pay

## 2021-11-25 VITALS — BP 125/62 | HR 90 | Wt 330.3 lb

## 2021-11-25 DIAGNOSIS — Z0130 Encounter for examination of blood pressure without abnormal findings: Secondary | ICD-10-CM

## 2021-11-25 NOTE — Progress Notes (Signed)
Chart reviewed for nurse visit. Agree with plan of care.   Venora Maples, MD 11/25/21 1:19 PM

## 2021-11-25 NOTE — Progress Notes (Signed)
Pt here today for BP check after recommendation from clinical staff to come in for BP check after pt reports having a headache.  Pt denies headache or visual disturbances.  BP 125/62.  Pt informs me that she did not take Tylenol as recommended as she laid down and it was effective.  I advised pt to utilize Tylenol 1000 mg if she has another headache to see if it is effective.  If it is not effective we can prescribe something different at her appt scheduled on 12/03/21.  Pt verbalized understanding with no further question.   Addison Naegeli, RN  11/25/21

## 2021-11-26 ENCOUNTER — Ambulatory Visit: Payer: Medicaid Other | Attending: Maternal & Fetal Medicine

## 2021-11-26 ENCOUNTER — Ambulatory Visit: Payer: Medicaid Other | Admitting: *Deleted

## 2021-11-26 ENCOUNTER — Other Ambulatory Visit: Payer: Self-pay | Admitting: *Deleted

## 2021-11-26 VITALS — BP 114/64 | HR 96

## 2021-11-26 DIAGNOSIS — Z3480 Encounter for supervision of other normal pregnancy, unspecified trimester: Secondary | ICD-10-CM | POA: Diagnosis present

## 2021-11-26 DIAGNOSIS — O43192 Other malformation of placenta, second trimester: Secondary | ICD-10-CM | POA: Diagnosis present

## 2021-11-26 DIAGNOSIS — O99213 Obesity complicating pregnancy, third trimester: Secondary | ICD-10-CM | POA: Insufficient documentation

## 2021-11-26 DIAGNOSIS — O43193 Other malformation of placenta, third trimester: Secondary | ICD-10-CM | POA: Insufficient documentation

## 2021-11-26 DIAGNOSIS — E669 Obesity, unspecified: Secondary | ICD-10-CM | POA: Diagnosis not present

## 2021-11-26 DIAGNOSIS — Z3A29 29 weeks gestation of pregnancy: Secondary | ICD-10-CM

## 2021-11-26 DIAGNOSIS — Z362 Encounter for other antenatal screening follow-up: Secondary | ICD-10-CM | POA: Diagnosis not present

## 2021-12-03 ENCOUNTER — Other Ambulatory Visit: Payer: Self-pay

## 2021-12-03 ENCOUNTER — Ambulatory Visit (INDEPENDENT_AMBULATORY_CARE_PROVIDER_SITE_OTHER): Payer: Medicaid Other | Admitting: Family Medicine

## 2021-12-03 VITALS — BP 137/80 | HR 89 | Wt 337.4 lb

## 2021-12-03 DIAGNOSIS — O43199 Other malformation of placenta, unspecified trimester: Secondary | ICD-10-CM

## 2021-12-03 DIAGNOSIS — Z3480 Encounter for supervision of other normal pregnancy, unspecified trimester: Secondary | ICD-10-CM

## 2021-12-03 NOTE — Patient Instructions (Signed)

## 2021-12-03 NOTE — Progress Notes (Signed)
° °  Subjective:  Ashley Dillon is a 20 y.o. G1P0000 at [redacted]w[redacted]d being seen today for ongoing prenatal care.  She is currently monitored for the following issues for this high-risk pregnancy and has Supervision of other normal pregnancy, antepartum and Marginal insertion of umbilical cord affecting management of mother on their problem list.  Patient reports no complaints.  Contractions: Irritability. Vag. Bleeding: None.  Movement: Present. Denies leaking of fluid.   The following portions of the patient's history were reviewed and updated as appropriate: allergies, current medications, past family history, past medical history, past social history, past surgical history and problem list. Problem list updated.  Objective:   Vitals:   12/03/21 1050  BP: 137/80  Pulse: 89  Weight: (!) 337 lb 6.4 oz (153 kg)    Fetal Status: Fetal Heart Rate (bpm): 137   Movement: Present     General:  Alert, oriented and cooperative. Patient is in no acute distress.  Skin: Skin is warm and dry. No rash noted.   Cardiovascular: Normal heart rate noted  Respiratory: Normal respiratory effort, no problems with respiration noted  Abdomen: Soft, gravid, appropriate for gestational age. Pain/Pressure: Absent     Pelvic: Vag. Bleeding: None     Cervical exam deferred        Extremities: Normal range of motion.  Edema: None  Mental Status: Normal mood and affect. Normal behavior. Normal judgment and thought content.   Urinalysis:      Assessment and Plan:  Pregnancy: G1P0000 at [redacted]w[redacted]d  1. Supervision of other normal pregnancy, antepartum BP and FHR normal  2. Marginal insertion of umbilical cord affecting management of mother Following w MFM Last growth [place] 11/26/2021, EFW 26%  Preterm labor symptoms and general obstetric precautions including but not limited to vaginal bleeding, contractions, leaking of fluid and fetal movement were reviewed in detail with the patient. Please refer to After Visit Summary  for other counseling recommendations.  Return in 2 weeks (on 12/17/2021) for Dyad patient, ob visit.   Venora Maples, MD

## 2021-12-09 ENCOUNTER — Encounter: Payer: Self-pay | Admitting: Diagnostic Neuroimaging

## 2021-12-09 ENCOUNTER — Ambulatory Visit (INDEPENDENT_AMBULATORY_CARE_PROVIDER_SITE_OTHER): Payer: Medicaid Other | Admitting: Diagnostic Neuroimaging

## 2021-12-09 VITALS — BP 103/64 | HR 105 | Ht 63.0 in | Wt 335.2 lb

## 2021-12-09 DIAGNOSIS — G4489 Other headache syndrome: Secondary | ICD-10-CM

## 2021-12-09 DIAGNOSIS — R519 Headache, unspecified: Secondary | ICD-10-CM | POA: Diagnosis not present

## 2021-12-09 DIAGNOSIS — Z3A31 31 weeks gestation of pregnancy: Secondary | ICD-10-CM

## 2021-12-09 DIAGNOSIS — Z3480 Encounter for supervision of other normal pregnancy, unspecified trimester: Secondary | ICD-10-CM

## 2021-12-09 NOTE — Patient Instructions (Signed)
°-   check MRI brain wo  - tylenol as needed  To prevent or relieve headaches, try the following: Cool Compress. Lie down and place a cool compress on your head.   Avoid headache triggers. If certain foods or odors seem to have triggered your migraines in the past, avoid them. A headache diary might help you identify triggers.   Include physical activity in your daily routine.  Manage stress. Find healthy ways to cope with the stressors, such as delegating tasks on your to-do list.   Practice relaxation techniques. Try deep breathing, yoga, massage and visualization.   Eat regularly. Eating regularly scheduled meals and maintaining a healthy diet might help prevent headaches. Also, drink plenty of fluids.   Follow a regular sleep schedule. Sleep deprivation might contribute to headaches Consider biofeedback. With this mind-body technique, you learn to control certain bodily functions -- such as muscle tension, heart rate and blood pressure -- to prevent headaches or reduce headache pain.

## 2021-12-09 NOTE — Progress Notes (Signed)
[facility]  PATIENT: Ashley Dillon DOB: 2000/12/17  REFERRING CLINICIAN: Conley Rolls, [place], OD HISTORY FROM: patient  REASON FOR VISIT: new consult   HISTORICAL  CHIEF COMPLAINT:  Chief Complaint  Patient presents with   Papilledema, headaches    Rm 7  New Pt  "[redacted] weeks pregnant, no headache right now"    HISTORY OF PRESENT ILLNESS:   21 year old g1p0, [redacted] weeks EGA pregnant female here for evaluation of headaches and papilledema.  Patient has history of headaches since teenage years with occipital, right-sided pain, sensitive to the light and sometimes seeing spots and sparkles.  Patient manages conservatively with over-the-counter medications in the past.  Since becoming pregnant patient having slightly increased headaches.  She went to eye doctor for evaluation and was found to have mild papilledema.  No transient visual obscurations.  No tinnitus.  Patient having headaches every 1 to 2 weeks.    REVIEW OF SYSTEMS: Full 14 system review of systems performed and negative with exception of: As per HPI.  ALLERGIES: No Known Allergies  HOME MEDICATIONS: No outpatient medications prior to visit.   No facility-administered medications prior to visit.    PAST MEDICAL HISTORY: Past Medical History:  Diagnosis Date   Chronic headaches    Medical history non-contributory     PAST SURGICAL HISTORY: Past Surgical History:  Procedure Laterality Date   WISDOM TOOTH EXTRACTION      FAMILY HISTORY: Family History  Problem Relation Age of Onset   Diabetes Mother    Cancer Paternal Grandmother     SOCIAL HISTORY: Social History   Socioeconomic History   Marital status: Single    Spouse name: Not on file   Number of children: 0   Years of education: Not on file   Highest education level: High school graduate  Occupational History   Not on file  Tobacco Use   Smoking status: Never   Smokeless tobacco: Never  Vaping Use   Vaping Use: Never  used  Substance and Sexual Activity   Alcohol use: No   Drug use: Never   Sexual activity: Yes    Birth control/protection: None  Other Topics Concern   Not on file  Social History Narrative   Lives with sig other and brother   Caffeine- none   Social Determinants of Health   Financial Resource Strain: Not on file  Food Insecurity: Not on file  Transportation Needs: Not on file  Physical Activity: Not on file  Stress: Not on file  Social Connections: Not on file  Intimate Partner Violence: Not on file     PHYSICAL EXAM  GENERAL EXAM/CONSTITUTIONAL: Vitals:  Vitals:   12/09/21 1241  BP: 103/64  Pulse: (!) 105  Weight: (!) 335 lb 3.2 oz (152 kg)  Height: 5\' 3"  (1.6 m)   Body mass index is 59.38 kg/m. Wt Readings from Last 3 Encounters:  12/09/21 (!) 335 lb 3.2 oz (152 kg)  12/03/21 (!) 337 lb 6.4 oz (153 kg)  11/25/21 (!) 330 lb 4.8 oz (149.8 kg)   Patient is in no distress; well developed, nourished and groomed; neck is supple  CARDIOVASCULAR: Examination of carotid arteries is normal; no carotid bruits Regular rate and rhythm, no murmurs Examination of peripheral vascular system by observation and palpation is normal  EYES: No results found.  MUSCULOSKELETAL: Gait, strength, tone, movements noted in Neurologic exam below  NEUROLOGIC: MENTAL STATUS:  No flowsheet data found. awake, alert, oriented to person, place and time recent  and remote memory intact normal attention and concentration language fluent, comprehension intact, naming intact fund of knowledge appropriate  CRANIAL NERVE:  2nd -  PHOTOSENSITIVE; DIFF TO VISUALIZE fundoscopic exam 2nd, 3rd, 4th, 6th - pupils equal and reactive to light, visual fields full to confrontation, extraocular muscles intact, no nystagmus 5th - facial sensation symmetric 7th - facial strength symmetric 8th - hearing intact 9th - palate elevates symmetrically, uvula midline 11th - shoulder shrug symmetric 12th  - tongue protrusion midline  MOTOR:  normal bulk and tone, full strength in the BUE, BLE  SENSORY:  normal and symmetric to light touch, temperature, vibration  COORDINATION:  finger-nose-finger, fine finger movements normal  REFLEXES:  deep tendon reflexes TRACE and symmetric  GAIT/STATION:  narrow based gait     DIAGNOSTIC DATA (LABS, IMAGING, TESTING) - I reviewed patient records, labs, notes, testing and imaging myself where available.  Lab Results  Component Value Date   WBC 13.6 (H) 11/19/2021   HGB 10.4 (L) 11/19/2021   HCT 31.4 (L) 11/19/2021   MCV 94 11/19/2021   PLT 230 11/19/2021   No results found for: NA, K, CL, CO2, GLUCOSE, BUN, CREATININE, CALCIUM, PROT, ALBUMIN, AST, ALT, ALKPHOS, BILITOT, GFRNONAA, GFRAA No results found for: CHOL, HDL, LDLCALC, LDLDIRECT, TRIG, CHOLHDL Lab Results  Component Value Date   HGBA1C 4.4 (L) 08/26/2021   No results found for: MEQASTMH96 Lab Results  Component Value Date   TSH 1.210 09/05/2018       ASSESSMENT AND PLAN  21 y.o. year old female here with history of headaches since teenage years, with changing headaches since pregnancy, currently [redacted] weeks EGA.   Dx:  1. Worsening headaches   2. Supervision of other normal pregnancy, antepartum   3. [redacted] weeks gestation of pregnancy   4. Other headache syndrome       PLAN:  HEADACHES (since teenage years; suspected migraine with aura; now with change in headaches during pregnancy; could be IIH or other secondary headaches)  - check MRI brain wo  - tylenol as needed  To prevent or relieve headaches, try the following: Cool Compress. Lie down and place a cool compress on your head.   Avoid headache triggers. If certain foods or odors seem to have triggered your migraines in the past, avoid them. A headache diary might help you identify triggers.   Include physical activity in your daily routine.  Manage stress. Find healthy ways to cope with the stressors,  such as delegating tasks on your to-do list.   Practice relaxation techniques. Try deep breathing, yoga, massage and visualization.   Eat regularly. Eating regularly scheduled meals and maintaining a healthy diet might help prevent headaches. Also, drink plenty of fluids.   Follow a regular sleep schedule. Sleep deprivation might contribute to headaches Consider biofeedback. With this mind-body technique, you learn to control certain bodily functions -- such as muscle tension, heart rate and blood pressure -- to prevent headaches or reduce headache pain.   Orders Placed This Encounter  Procedures   MR BRAIN WO CONTRAST    Return in about 4 months (around 04/08/2022).  I reviewed images, labs, notes, records myself. I summarized findings and reviewed with patient, for this high risk condition (headaches, papilledema, pregnancy) requiring high complexity decision making.    Suanne Marker, MD 12/09/2021, 1:16 PM Certified in Neurology, Neurophysiology and Neuroimaging  [facility] [address] [place], [place] [address] [phone]

## 2021-12-18 ENCOUNTER — Telehealth: Payer: Self-pay | Admitting: Diagnostic Neuroimaging

## 2021-12-18 NOTE — Telephone Encounter (Signed)
[facility] pending faxed notes  

## 2021-12-18 NOTE — Telephone Encounter (Signed)
[facility] Berkley Harvey: [id number] (exp. 12/18/21 to 01/17/22) order sent to GI, they will reach out to the patient to schedule.

## 2021-12-22 ENCOUNTER — Ambulatory Visit (INDEPENDENT_AMBULATORY_CARE_PROVIDER_SITE_OTHER): Payer: Medicaid Other | Admitting: Family Medicine

## 2021-12-22 ENCOUNTER — Other Ambulatory Visit: Payer: Self-pay

## 2021-12-22 VITALS — BP 133/80 | HR 120 | Wt 325.7 lb

## 2021-12-22 DIAGNOSIS — Z3480 Encounter for supervision of other normal pregnancy, unspecified trimester: Secondary | ICD-10-CM

## 2021-12-22 DIAGNOSIS — O0990 Supervision of high risk pregnancy, unspecified, unspecified trimester: Secondary | ICD-10-CM

## 2021-12-22 DIAGNOSIS — O99210 Obesity complicating pregnancy, unspecified trimester: Secondary | ICD-10-CM

## 2021-12-22 DIAGNOSIS — Z5941 Food insecurity: Secondary | ICD-10-CM

## 2021-12-22 NOTE — Patient Instructions (Signed)

## 2021-12-22 NOTE — Progress Notes (Signed)
° °  PRENATAL VISIT NOTE  Subjective:  Ashley Dillon is a 21 y.o. G1P0000 at [redacted]w[redacted]d being seen today for ongoing prenatal care.  She is currently monitored for the following issues for this high-risk pregnancy and has Supervision of other normal pregnancy, antepartum and Marginal insertion of umbilical cord affecting management of mother on their problem list.  Patient reports no complaints.  Contractions: Irritability. Vag. Bleeding: None.  Movement: Present. Denies leaking of fluid.   The following portions of the patient's history were reviewed and updated as appropriate: allergies, current medications, past family history, past medical history, past social history, past surgical history and problem list.   Objective:   Vitals:   12/22/21 1104  BP: 133/80  Pulse: (!) 120  Weight: (!) 325 lb 11.2 oz (147.7 kg)    Fetal Status: Fetal Heart Rate (bpm): 154 Fundal Height: 33 cm Movement: Present     General:  Alert, oriented and cooperative. Patient is in no acute distress.  Skin: Skin is warm and dry. No rash noted.   Cardiovascular: Normal heart rate noted  Respiratory: Normal respiratory effort, no problems with respiration noted  Abdomen: Soft, gravid, appropriate for gestational age.  Pain/Pressure: Present     Pelvic: Cervical exam deferred        Extremities: Normal range of motion.  Edema: None  Mental Status: Normal mood and affect. Normal behavior. Normal judgment and thought content.   Assessment and Plan:  Pregnancy: G1P0000 at [redacted]w[redacted]d 1. Supervision of other normal pregnancy, antepartum FH is difficult to assess- has [place] to look at [facility] Reviewed birth planning Ask about ppost placental IUD  2. Food insecurity - AMBULATORY REFERRAL TO [place] [facility]  Preterm labor symptoms and general obstetric precautions including but not limited to vaginal bleeding, contractions, leaking of fluid and fetal movement were reviewed in detail with the patient. Please refer to After  Visit Summary for other counseling recommendations.   No follow-ups on file.  Future Appointments  Date Time Provider [place]  01/05/2022 10:30 AM [facility] NURSE [facility]  01/05/2022 10:45 AM [facility] US4 [facility]  01/05/2022  6:20 PM GI-315 MR 3 GI-315MRI GI-[address]  01/07/2022  2:35 PM [facility]  01/22/2022  8:55 AM [facility]YDYAD [facility]  01/30/2022 10:35 AM [facility]  02/06/2022  9:35 AM [facility]YDYAD [facility]  04/14/2022  3:30 PM Penumalli, Earlean Polka, MD [facility] None    Caren Macadam, MD

## 2021-12-22 NOTE — Progress Notes (Signed)
Patient in for routine prenatal visit. No issues or concerns at this time. Reports good fetal movement. Patient is not taking prenatals at this time, states they make her feel sick.  Altha Harm, CMA

## 2021-12-24 ENCOUNTER — Ambulatory Visit: Payer: Medicaid Other

## 2021-12-25 DIAGNOSIS — O99210 Obesity complicating pregnancy, unspecified trimester: Secondary | ICD-10-CM | POA: Insufficient documentation

## 2022-01-05 ENCOUNTER — Other Ambulatory Visit: Payer: Self-pay | Admitting: *Deleted

## 2022-01-05 ENCOUNTER — Ambulatory Visit: Payer: Medicaid Other | Attending: Maternal & Fetal Medicine

## 2022-01-05 ENCOUNTER — Other Ambulatory Visit: Payer: Self-pay

## 2022-01-05 ENCOUNTER — Ambulatory Visit: Payer: Medicaid Other | Admitting: *Deleted

## 2022-01-05 ENCOUNTER — Ambulatory Visit
Admission: RE | Admit: 2022-01-05 | Discharge: 2022-01-05 | Disposition: A | Discharge status: Home / Self Care | Payer: Medicaid Other | Source: Ambulatory Visit | Attending: Diagnostic Neuroimaging | Admitting: Diagnostic Neuroimaging

## 2022-01-05 ENCOUNTER — Other Ambulatory Visit: Payer: Self-pay | Admitting: Maternal & Fetal Medicine

## 2022-01-05 ENCOUNTER — Encounter: Payer: Self-pay | Admitting: *Deleted

## 2022-01-05 DIAGNOSIS — Z3A35 35 weeks gestation of pregnancy: Secondary | ICD-10-CM

## 2022-01-05 DIAGNOSIS — O43193 Other malformation of placenta, third trimester: Secondary | ICD-10-CM | POA: Diagnosis not present

## 2022-01-05 DIAGNOSIS — O99210 Obesity complicating pregnancy, unspecified trimester: Secondary | ICD-10-CM | POA: Diagnosis not present

## 2022-01-05 DIAGNOSIS — O99213 Obesity complicating pregnancy, third trimester: Secondary | ICD-10-CM | POA: Diagnosis not present

## 2022-01-05 DIAGNOSIS — R519 Headache, unspecified: Secondary | ICD-10-CM

## 2022-01-05 DIAGNOSIS — O0990 Supervision of high risk pregnancy, unspecified, unspecified trimester: Secondary | ICD-10-CM | POA: Insufficient documentation

## 2022-01-05 DIAGNOSIS — Z6841 Body Mass Index (BMI) 40.0 and over, adult: Secondary | ICD-10-CM

## 2022-01-05 DIAGNOSIS — Z3A31 31 weeks gestation of pregnancy: Secondary | ICD-10-CM

## 2022-01-05 DIAGNOSIS — G4489 Other headache syndrome: Secondary | ICD-10-CM

## 2022-01-07 ENCOUNTER — Other Ambulatory Visit: Payer: Self-pay

## 2022-01-07 ENCOUNTER — Ambulatory Visit (INDEPENDENT_AMBULATORY_CARE_PROVIDER_SITE_OTHER): Payer: Medicaid Other | Admitting: Family Medicine

## 2022-01-07 DIAGNOSIS — O99210 Obesity complicating pregnancy, unspecified trimester: Secondary | ICD-10-CM

## 2022-01-07 DIAGNOSIS — O43199 Other malformation of placenta, unspecified trimester: Secondary | ICD-10-CM

## 2022-01-07 DIAGNOSIS — O0990 Supervision of high risk pregnancy, unspecified, unspecified trimester: Secondary | ICD-10-CM

## 2022-01-07 NOTE — Progress Notes (Signed)
° °  Subjective:  Ashley Dillon is a 21 y.o. G1P0000 at [redacted]w[redacted]d being seen today for ongoing prenatal care.  She is currently monitored for the following issues for this high-risk pregnancy and has Supervision of high risk pregnancy, antepartum; Marginal insertion of umbilical cord affecting management of mother; Morbid obesity ([place]); and Obesity complicating pregnancy, childbirth, or puerperium, antepartum on their problem list.  Patient reports no complaints.  Contractions: Irritability. Vag. Bleeding: None.  Movement: Present. Denies leaking of fluid.   The following portions of the patient's history were reviewed and updated as appropriate: allergies, current medications, past family history, past medical history, past social history, past surgical history and problem list. Problem list updated.  Objective:   Vitals:   01/07/22 1452  BP: 111/80  Pulse: (!) 101  Weight: (!) 333 lb 1.6 oz (151.1 kg)    Fetal Status: Fetal Heart Rate (bpm): 145   Movement: Present     General:  Alert, oriented and cooperative. Patient is in no acute distress.  Skin: Skin is warm and dry. No rash noted.   Cardiovascular: Normal heart rate noted  Respiratory: Normal respiratory effort, no problems with respiration noted  Abdomen: Soft, gravid, appropriate for gestational age. Pain/Pressure: Absent     Pelvic: Vag. Bleeding: None     Cervical exam deferred        Extremities: Normal range of motion.  Edema: None  Mental Status: Normal mood and affect. Normal behavior. Normal judgment and thought content.   Urinalysis:      Assessment and Plan:  Pregnancy: G1P0000 at [redacted]w[redacted]d  1. Morbid obesity ([place]) Following w MFM for antenatal testing BPP 8/8 earlier this week  2. Supervision of high risk pregnancy, antepartum BP and FHR normal Swabs next visit  3. Obesity complicating pregnancy, childbirth, or puerperium, antepartum   4. Marginal insertion of umbilical cord affecting management of  mother Normal growth [place] earlier this week, EFW 38%  Preterm labor symptoms and general obstetric precautions including but not limited to vaginal bleeding, contractions, leaking of fluid and fetal movement were reviewed in detail with the patient. Please refer to After Visit Summary for other counseling recommendations.  Return in 1 week (on 01/14/2022) for [place] patient, ob visit.   Clarnce Flock, MD

## 2022-01-07 NOTE — Patient Instructions (Signed)

## 2022-01-08 ENCOUNTER — Encounter: Payer: Self-pay | Admitting: *Deleted

## 2022-01-12 ENCOUNTER — Other Ambulatory Visit: Payer: Medicaid Other

## 2022-01-12 ENCOUNTER — Ambulatory Visit: Payer: Medicaid Other | Attending: Obstetrics and Gynecology

## 2022-01-19 ENCOUNTER — Ambulatory Visit: Payer: Medicaid Other | Attending: Obstetrics and Gynecology

## 2022-01-19 ENCOUNTER — Ambulatory Visit: Payer: Medicaid Other | Admitting: *Deleted

## 2022-01-19 ENCOUNTER — Other Ambulatory Visit: Payer: Self-pay

## 2022-01-19 ENCOUNTER — Encounter: Payer: Self-pay | Admitting: *Deleted

## 2022-01-19 DIAGNOSIS — O43193 Other malformation of placenta, third trimester: Secondary | ICD-10-CM | POA: Insufficient documentation

## 2022-01-19 DIAGNOSIS — O0990 Supervision of high risk pregnancy, unspecified, unspecified trimester: Secondary | ICD-10-CM | POA: Insufficient documentation

## 2022-01-19 DIAGNOSIS — O99213 Obesity complicating pregnancy, third trimester: Secondary | ICD-10-CM | POA: Insufficient documentation

## 2022-01-19 DIAGNOSIS — Z3A37 37 weeks gestation of pregnancy: Secondary | ICD-10-CM | POA: Insufficient documentation

## 2022-01-19 DIAGNOSIS — O99210 Obesity complicating pregnancy, unspecified trimester: Secondary | ICD-10-CM

## 2022-01-19 DIAGNOSIS — Z6841 Body Mass Index (BMI) 40.0 and over, adult: Secondary | ICD-10-CM

## 2022-01-19 DIAGNOSIS — O0993 Supervision of high risk pregnancy, unspecified, third trimester: Secondary | ICD-10-CM | POA: Diagnosis not present

## 2022-01-22 ENCOUNTER — Ambulatory Visit (INDEPENDENT_AMBULATORY_CARE_PROVIDER_SITE_OTHER): Payer: Medicaid Other | Admitting: Family Medicine

## 2022-01-22 ENCOUNTER — Other Ambulatory Visit: Payer: Self-pay

## 2022-01-22 ENCOUNTER — Other Ambulatory Visit (HOSPITAL_COMMUNITY)
Admission: RE | Admit: 2022-01-22 | Discharge: 2022-01-22 | Disposition: A | Discharge status: Home / Self Care | Payer: Medicaid Other | Source: Ambulatory Visit | Attending: Family Medicine | Admitting: Family Medicine

## 2022-01-22 VITALS — BP 112/75 | HR 97 | Wt 343.3 lb

## 2022-01-22 DIAGNOSIS — O0990 Supervision of high risk pregnancy, unspecified, unspecified trimester: Secondary | ICD-10-CM | POA: Diagnosis not present

## 2022-01-22 DIAGNOSIS — O99210 Obesity complicating pregnancy, unspecified trimester: Secondary | ICD-10-CM

## 2022-01-22 DIAGNOSIS — O321XX0 Maternal care for breech presentation, not applicable or unspecified: Secondary | ICD-10-CM

## 2022-01-22 LAB — OB RESULTS CONSOLE GC/CHLAMYDIA: Gonorrhea: NEGATIVE

## 2022-01-22 NOTE — Progress Notes (Signed)
° °  PRENATAL VISIT NOTE  Subjective:  Ashley Dillon is a 21 y.o. G1P0000 at [redacted]w[redacted]d being seen today for ongoing prenatal care.  She is currently monitored for the following issues for this high-risk pregnancy and has Supervision of high risk pregnancy, antepartum; Marginal insertion of umbilical cord affecting management of mother; Morbid obesity ([place]); Obesity complicating pregnancy, childbirth, or puerperium, antepartum; and Breech presentation, no version on their problem list.  Patient reports no complaints.  Contractions: Irritability. Vag. Bleeding: None.  Movement: Present. Denies leaking of fluid.   The following portions of the patient's history were reviewed and updated as appropriate: allergies, current medications, past family history, past medical history, past social history, past surgical history and problem list.   Objective:   Vitals:   01/22/22 0911  BP: 112/75  Pulse: 97  Weight: (!) 343 lb 4.8 oz (155.7 kg)    Fetal Status: Fetal Heart Rate (bpm): 146   Movement: Present  Presentation: Double Footling Breech  General:  Alert, oriented and cooperative. Patient is in no acute distress.  Skin: Skin is warm and dry. No rash noted.   Cardiovascular: Normal heart rate noted  Respiratory: Normal respiratory effort, no problems with respiration noted  Abdomen: Soft, gravid, appropriate for gestational age.  Pain/Pressure: Present     Pelvic: Cervical exam deferred Dilation: 3.5 Effacement (%): 90 Station: -3  Extremities: Normal range of motion.  Edema: None  Mental Status: Normal mood and affect. Normal behavior. Normal judgment and thought content.   Assessment and Plan:  Pregnancy: G1P0000 at [redacted]w[redacted]d  1. BREECH -poor ECV candidate given BMI, now nearly 38 weeks, and double footling. Discuss ECV on day of C-section - Message to scheduler sent today - Needs PCS at 39 wk scheduled-- 2/25 - recommend [place]ena given BMI  2.Supervision of high risk pregnancy,  antepartum Up to date Discussed IUD - desires postplacental  - Culture, beta strep (group b only) - GC/Chlamydia probe amp ([place])not at [facility]  3. Morbid obesity ([place])- BMI 60  4. Obesity complicating pregnancy, childbirth, or puerperium, antepartum TWG=93 lb 4.8 oz (42.3 kg)   Term labor symptoms and general obstetric precautions including but not limited to vaginal bleeding, contractions, leaking of fluid and fetal movement were reviewed in detail with the patient. Please refer to After Visit Summary for other counseling recommendations.   Return in about 1 week (around 01/29/2022) for Routine prenatal care, scheduled visit, [facility].  Future Appointments  Date Time Provider [place]  01/26/2022  1:00 PM [facility] NURSE [facility]  01/26/2022  1:15 PM [facility] US2 [facility]  01/30/2022 10:35 AM [facility]  02/02/2022 11:00 AM [facility] NURSE [facility]  02/02/2022 11:15 AM [facility] US2 [facility]  02/06/2022  9:35 AM [facility]YDYAD [facility]  04/14/2022  3:30 PM Penumalli, Earlean Polka, MD [facility] None    Caren Macadam, MD

## 2022-01-22 NOTE — Addendum Note (Signed)
Addended by: Caryl Bis on: 01/22/2022 04:54 PM   Modules accepted: Orders

## 2022-01-23 ENCOUNTER — Encounter: Payer: Self-pay | Admitting: Family Medicine

## 2022-01-23 ENCOUNTER — Telehealth: Payer: Self-pay | Admitting: Family Medicine

## 2022-01-23 LAB — GC/CHLAMYDIA PROBE AMP (~~LOC~~) NOT AT ARMC
Chlamydia: NEGATIVE
Comment: NEGATIVE
Comment: NORMAL
Neisseria Gonorrhea: NEGATIVE

## 2022-01-23 NOTE — Telephone Encounter (Signed)
I called the patient to discuss ECV  Patient is an ECV candidate if desired but low likelihood of success given the position of the infant (double footling) and BMI of 60.   Mychart message sent.

## 2022-01-26 ENCOUNTER — Ambulatory Visit (HOSPITAL_BASED_OUTPATIENT_CLINIC_OR_DEPARTMENT_OTHER): Payer: Medicaid Other

## 2022-01-26 ENCOUNTER — Inpatient Hospital Stay (HOSPITAL_COMMUNITY)
Admission: AD | Admit: 2022-01-26 | Discharge: 2022-01-29 | DRG: 787 | Disposition: A | Discharge status: Home / Self Care | Payer: Medicaid Other | Attending: Family Medicine | Admitting: Family Medicine

## 2022-01-26 ENCOUNTER — Other Ambulatory Visit: Payer: Self-pay

## 2022-01-26 ENCOUNTER — Encounter (HOSPITAL_COMMUNITY): Payer: Self-pay

## 2022-01-26 ENCOUNTER — Ambulatory Visit: Payer: Medicaid Other | Admitting: *Deleted

## 2022-01-26 ENCOUNTER — Encounter (HOSPITAL_COMMUNITY): Payer: Self-pay | Admitting: Family Medicine

## 2022-01-26 ENCOUNTER — Encounter (HOSPITAL_COMMUNITY): Payer: Self-pay | Admitting: Obstetrics & Gynecology

## 2022-01-26 DIAGNOSIS — Z20822 Contact with and (suspected) exposure to covid-19: Secondary | ICD-10-CM | POA: Diagnosis present

## 2022-01-26 DIAGNOSIS — O99214 Obesity complicating childbirth: Secondary | ICD-10-CM | POA: Diagnosis present

## 2022-01-26 DIAGNOSIS — O99210 Obesity complicating pregnancy, unspecified trimester: Secondary | ICD-10-CM | POA: Insufficient documentation

## 2022-01-26 DIAGNOSIS — O9902 Anemia complicating childbirth: Secondary | ICD-10-CM | POA: Diagnosis not present

## 2022-01-26 DIAGNOSIS — O321XX0 Maternal care for breech presentation, not applicable or unspecified: Secondary | ICD-10-CM | POA: Diagnosis not present

## 2022-01-26 DIAGNOSIS — O43193 Other malformation of placenta, third trimester: Secondary | ICD-10-CM | POA: Diagnosis present

## 2022-01-26 DIAGNOSIS — D62 Acute posthemorrhagic anemia: Secondary | ICD-10-CM | POA: Diagnosis not present

## 2022-01-26 DIAGNOSIS — Z3A38 38 weeks gestation of pregnancy: Secondary | ICD-10-CM

## 2022-01-26 DIAGNOSIS — O43199 Other malformation of placenta, unspecified trimester: Secondary | ICD-10-CM | POA: Diagnosis present

## 2022-01-26 DIAGNOSIS — O0990 Supervision of high risk pregnancy, unspecified, unspecified trimester: Secondary | ICD-10-CM | POA: Insufficient documentation

## 2022-01-26 DIAGNOSIS — Z30430 Encounter for insertion of intrauterine contraceptive device: Secondary | ICD-10-CM

## 2022-01-26 DIAGNOSIS — Z98891 History of uterine scar from previous surgery: Secondary | ICD-10-CM

## 2022-01-26 DIAGNOSIS — E668 Other obesity: Secondary | ICD-10-CM

## 2022-01-26 DIAGNOSIS — O99213 Obesity complicating pregnancy, third trimester: Secondary | ICD-10-CM | POA: Diagnosis not present

## 2022-01-26 DIAGNOSIS — Z6841 Body Mass Index (BMI) 40.0 and over, adult: Secondary | ICD-10-CM | POA: Insufficient documentation

## 2022-01-26 DIAGNOSIS — O321XX1 Maternal care for breech presentation, fetus 1: Secondary | ICD-10-CM | POA: Diagnosis not present

## 2022-01-26 LAB — CULTURE, BETA STREP (GROUP B ONLY): Strep Gp B Culture: NEGATIVE

## 2022-01-26 LAB — RESP PANEL BY RT-PCR (FLU A&B, COVID) ARPGX2
Influenza A by PCR: NEGATIVE
Influenza B by PCR: NEGATIVE
SARS Coronavirus 2 by RT PCR: NEGATIVE

## 2022-01-26 MED ORDER — ACETAMINOPHEN 325 MG PO TABS: 650.0000 mg | ORAL_TABLET | ORAL | Status: DC | PRN

## 2022-01-26 MED ORDER — SODIUM CHLORIDE 0.9% FLUSH: 3.0000 mL | Freq: Two times a day (BID) | INTRAVENOUS | Status: DC

## 2022-01-26 MED ORDER — SODIUM CHLORIDE 0.9% FLUSH: 3.0000 mL | INTRAVENOUS | Status: DC | PRN

## 2022-01-26 MED ORDER — CALCIUM CARBONATE ANTACID 500 MG PO CHEW: 2.0000 | CHEWABLE_TABLET | ORAL | Status: DC | PRN

## 2022-01-26 MED ORDER — ZOLPIDEM TARTRATE 5 MG PO TABS: 5.0000 mg | ORAL_TABLET | Freq: Every evening | ORAL | Status: DC | PRN

## 2022-01-26 MED ORDER — PRENATAL MULTIVITAMIN CH: 1.0000 | ORAL_TABLET | Freq: Every day | ORAL | Status: DC

## 2022-01-26 MED ORDER — SODIUM CHLORIDE 0.9 % IV SOLN: 250.0000 mL | INTRAVENOUS | Status: DC | PRN

## 2022-01-26 MED ORDER — DOCUSATE SODIUM 100 MG PO CAPS: 100.0000 mg | ORAL_CAPSULE | Freq: Every day | ORAL | Status: DC

## 2022-01-26 NOTE — Progress Notes (Signed)
Secure chat sent to RN to inquire if pt is out of shower and ready for VAS Team to start her IV.

## 2022-01-26 NOTE — H&P (Signed)
FACULTY PRACTICE ANTEPARTUM ADMISSION HISTORY AND PHYSICAL NOTE   History of Present Illness: Ashley Dillon is a 21 y.o. G1P0000 at [redacted]w[redacted]d admitted for BPP of 4/8 at term and breech presentation.  She is scheduled for primary cesarean section in the morning due to breech presentation. Patient reports the fetal movement as active. Patient reports uterine contraction  activity as none. Patient reports  vaginal bleeding as none. Patient describes fluid per vagina as None. Fetal presentation is cephalic.  Patient Active Problem List   Diagnosis Date Noted   Breech presentation of fetus 01/26/2022   Breech presentation, no version 01/22/2022   Morbid obesity ([place]) [id number]   Obesity complicating pregnancy, childbirth, or puerperium, antepartum 12/25/2021   Marginal insertion of umbilical cord affecting management of mother 11/19/2021   Supervision of high risk pregnancy, antepartum 08/13/2021    Past Medical History:  Diagnosis Date   Chronic headaches     Past Surgical History:  Procedure Laterality Date   WISDOM TOOTH EXTRACTION      OB History  Gravida Para Term Preterm AB Living  1 0 0 0 0 0  SAB IAB Ectopic Multiple Live Births  0 0 0 0 0    # Outcome Date GA Lbr Len/2nd Weight Sex Delivery Anes PTL Lv  1 Current             Social History   Socioeconomic History   Marital status: Significant Other    Spouse name: Not on file   Number of children: 0   Years of education: Not on file   Highest education level: High school graduate  Occupational History   Not on file  Tobacco Use   Smoking status: Never   Smokeless tobacco: Never  Vaping Use   Vaping Use: Never used  Substance and Sexual Activity   Alcohol use: No   Drug use: Never   Sexual activity: Yes    Birth control/protection: None  Other Topics Concern   Not on file  Social History Narrative   Lives with sig other and brother   Caffeine- none   Social Determinants of Health   Financial  Resource Strain: Not on file  Food Insecurity: Not on file  Transportation Needs: Not on file  Physical Activity: Not on file  Stress: Not on file  Social Connections: Not on file    Family History  Problem Relation Age of Onset   Diabetes Mother    Cancer Paternal Grandmother     No Known Allergies  No medications prior to admission.    Review of Systems - History obtained from chart review and the patient  Vitals:  BP (!) 106/59 (BP Location: Left Arm)    Pulse (!) 106    Temp 98.3 F (36.8 C) (Oral)    Resp 20    Ht 5\' 3"  (1.6 m)    Wt (!) 151.5 kg    LMP 04/11/2021 (Approximate)    SpO2 99%    BMI 59.16 kg/m  Physical Examination: CONSTITUTIONAL: Well-developed,obese female in no acute distress.  HENT:  Normocephalic, atraumatic, External right and left ear normal. Oropharynx is clear and moist EYES: Conjunctivae and EOM are normal.  NECK: Normal range of motion, supple, no masses SKIN: Skin is warm and dry. No rash noted. Not diaphoretic. No erythema. No pallor. [place]: Alert and oriented to person, place, and time. Normal reflexes, muscle tone coordination. No cranial nerve deficit noted. PSYCHIATRIC: Normal mood and affect. Normal behavior. Normal judgment and thought  content. CARDIOVASCULAR: Normal heart rate noted, regular rhythm RESPIRATORY: Effort and breath sounds normal, no problems with respiration noted ABDOMEN: Soft, nontender, nondistended, gravid. MUSCULOSKELETAL: Normal range of motion. No edema and no tenderness. 2+ distal pulses.  Cervix:deferred Fetal Monitoring:Baseline: 130's bpm, Variability: marked, Accelerations: Reactive, and Decelerations: Absent Tocometer: Flat  Labs:  Results for orders placed or performed during the hospital encounter of 01/26/22 (from the past 24 hour(s))  Resp Panel by RT-PCR (Flu A&B, Covid) Nasopharyngeal Swab   Collection Time: 01/26/22  7:48 PM   Specimen: Nasopharyngeal Swab; Nasopharyngeal(NP) swabs in vial  transport medium  Result Value Ref Range   SARS Coronavirus 2 by RT PCR NEGATIVE NEGATIVE   Influenza A by PCR NEGATIVE NEGATIVE   Influenza B by PCR NEGATIVE NEGATIVE    Imaging Studies: MR BRAIN WO CONTRAST  Result Date: 01/06/2022 [facility] NEUROIMAGING REPORT STUDY DATE: 01/05/22 PATIENT NAME: Ashley Dillon DOB: 2001/07/13 MRN: [id number] ORDERING CLINICIAN: Penni Bombard, MD CLINICAL HISTORY: 21 year old female with headaches and floaters. EXAM: MR BRAIN WO CONTRAST TECHNIQUE: MRI of the brain without contrast was obtained utilizing 5 mm axial slices with T1, T2, T2 flair, SWI and diffusion weighted views.  T1 sagittal and T2 coronal views were obtained. CONTRAST: none COMPARISON: none IMAGING SITE: [facility] [address] (1.5 Tesla MRI)  FINDINGS: No abnormal lesions are seen on diffusion-weighted views to suggest acute ischemia. The cortical sulci, fissures and cisterns are normal in size and appearance. Lateral, third and fourth ventricle are normal in size and appearance. No extra-axial fluid collections are seen. No evidence of mass effect or midline shift.  On sagittal views the posterior fossa, pituitary gland and corpus callosum are unremarkable. No evidence of intracranial hemorrhage on SWI views. The orbits and their contents, paranasal sinuses and calvarium are unremarkable. Mild mucus thickening in the maxillary and ethmoid sinuses. Intracranial flow voids are present.   Normal MRI brain (without). INTERPRETING PHYSICIAN: Penni Bombard, MD Certified in Neurology, Neurophysiology and Neuroimaging [facility] [address], [place], [place] [address] [phone]   [place] MFM FETAL BPP WO NON STRESS  Result Date: 01/26/2022 ----------------------------------------------------------------------  OBSTETRICS REPORT                       (Signed Final 01/26/2022 02:51 pm)  ---------------------------------------------------------------------- Patient Info  ID #:       [id number]                          D.O.B.:  05-22-2001 (21 yrs)  Name:       Ashley Dillon                 Visit Date: 01/26/2022 01:30 pm ---------------------------------------------------------------------- Performed By  Attending:        Tama High MD        Referred By:      Renee Harder  Performed By:     Rodrigo Ran BS      [facility]  RD[facility] RVT                                 Fetal Care at                                                             [place] for                                                             Women ---------------------------------------------------------------------- Orders  #  Description                           Code        Ordered By  1  [place] MFM FETAL BPP WO NON               76819.01    [facility]VI [facility]     STRESS ----------------------------------------------------------------------  #  Order #                     Accession #                Episode #  1  [id number]                   [id number]                 [id number] ---------------------------------------------------------------------- Indications  [redacted] weeks gestation of pregnancy                Z3A.38  Marginal insertion of umbilical cord affecting O43.193  management of mother in third trimester  Obesity complicating pregnancy, third          O99.213  trimester (BMI 50)  Encounter for other antenatal screening        Z36.2  follow-up ---------------------------------------------------------------------- Fetal Evaluation  Num Of Fetuses:         1  Fetal Heart Rate(bpm):  143  Cardiac Activity:       Observed  Presentation:           Breech  Amniotic Fluid  AFI FV:      Within normal limits  AFI Sum(cm)     %Tile       Largest Pocket(cm)  13.9            54          7.6  RUQ(cm)       RLQ(cm)       LUQ(cm)         LLQ(cm)  7.6           1.1           2.6            2.6 ---------------------------------------------------------------------- Biophysical Evaluation  Amniotic F.V:   Within normal limits       F. Tone:        Not Observed  F. Movement:    Not Observed  Score:          4/8  F. Breathing:   Observed ---------------------------------------------------------------------- Biometry  LV:        3.8  mm ---------------------------------------------------------------------- Gestational Age  LMP:           41w 3d        Date:  04/11/21                 EDD:   01/16/22  Best:          38w 2d     Det. By:  U/S  (09/03/21)          EDD:   02/07/22 ---------------------------------------------------------------------- Anatomy  Diaphragm:             Appears normal         Kidneys:                Appear normal  Stomach:               Appears normal, left   Bladder:                Appears normal                         sided ---------------------------------------------------------------------- Impression  Class III obesity.  Patient returned for antenatal testing.  Marginal cord insertion.  Amniotic fluid is normal.  Fetal movements and tone did not  meet the criteria of BPP.  Fetal breathing movements are  seen.  BPP 4/8.  Breech presentation.  I discussed the significance of poor biophysical profile and  recommended delivery.  I discussed with Dr.Eure. ---------------------------------------------------------------------- Recommendations  - Patient is admitted to [facility] specialty today.  -Cesarean delivery tomorrow.  -If patient was ECB, it should be performed only if NST is  reactive.  -Repeat biophysical profile is not necessary.  -If NST is nonreactive, proceed to delivery today. ----------------------------------------------------------------------                  Noralee Space, MD Electronically Signed Final Report   01/26/2022 02:51 pm ----------------------------------------------------------------------  [place]  MFM FETAL BPP WO NON STRESS  Result Date: 01/19/2022 ----------------------------------------------------------------------  OBSTETRICS REPORT                        (Signed Final 01/19/2022 07:51 pm) ---------------------------------------------------------------------- Patient Info  ID #:       [id number]                          D.O.B.:  Jun 17, 2001 (21 yrs)  Name:       Ashley Dillon                 Visit Date: 01/19/2022 03:11 pm ---------------------------------------------------------------------- Performed By  Attending:        Lin Landsman      Referred By:       Carla Drape                    MD                                        SIMPSON  Performed By:     Tommie Raymond BS,       [facility]                    RD[facility], RVT                                 Fetal Care at                                                              [facility] for                                                              Women ---------------------------------------------------------------------- Orders  #  Description                           Code        Ordered By  1  [place] MFM FETAL BPP WO NON               76819.01    [facility]     STRESS ----------------------------------------------------------------------  #  Order #                     Accession #                Episode #  1  [id number]                   [id number]                 [id number] ---------------------------------------------------------------------- Indications  [redacted] weeks gestation of pregnancy                 Z3A.37  Marginal insertion of umbilical cord affecting  O43.193  management of mother in third trimester  Obesity complicating pregnancy, third           O99.213  trimester (BMI 50) ---------------------------------------------------------------------- Fetal Evaluation  Num Of Fetuses:          1  Cardiac Activity:        Observed  Presentation:            Breech  Placenta:                Posterior  P. Cord Insertion:        Marginal insertion prev.  vis.  Amniotic Fluid  AFI FV:      Within normal limits ---------------------------------------------------------------------- Biophysical Evaluation  Amniotic F.V:   Pocket => 2 cm             F. Tone:         Observed  F. Movement:    Observed                   Score:           8/8  F. Breathing:   Observed ---------------------------------------------------------------------- Gestational Age  LMP:           46w 3d        Date:  04/11/21  EDD:   01/16/22  Best:          37w 2d     Det. By:  U/S  (09/03/21)          EDD:   02/07/22 ---------------------------------------------------------------------- Anatomy  Ventricles:            Appears normal         Kidneys:                Appear normal  Diaphragm:             Appears normal         Bladder:                Appears normal  Stomach:               Appears normal, left                         sided  Other:  Technicallly difficult due to advanced GA and maternal habitus. ---------------------------------------------------------------------- Cervix Uterus Adnexa  Cervix  Not visualized (advanced GA >24wks)  Uterus  No abnormality visualized.  Right Ovary  Within normal limits.  Left Ovary  Within normal limits.  Cul De Sac  No free fluid seen.  Adnexa  No abnormality visualized. ---------------------------------------------------------------------- Impression  Antenatal testing performed given maternal marginal cord  insertion and elevated BMI.  The biophysical profile was 8/8 with good fetal movement and  amniotic fluid volume. ---------------------------------------------------------------------- Recommendations  Continue weekly testing. ----------------------------------------------------------------------               Sander Nephew, MD Electronically Signed Final Report   01/19/2022 07:51 pm ----------------------------------------------------------------------  [place] MFM FETAL BPP WO NON STRESS  Result Date:  01/05/2022 ----------------------------------------------------------------------  OBSTETRICS REPORT                       (Signed Final 01/05/2022 04:21 pm) ---------------------------------------------------------------------- Patient Info  ID #:       [id number]                          D.O.B.:  Mar 19, 2001 (21 yrs)  Name:       Ashley Dillon                 Visit Date: 01/05/2022 11:15 am ---------------------------------------------------------------------- Performed By  Attending:        Tama High MD        Referred By:      Renee Harder  Performed By:     Germain Osgood            [facility]         [facility]                    RDMS                                     Fetal [facility] at  [facility] for                                                             Women ---------------------------------------------------------------------- Orders  #  Description                           Code        Ordered By  1  [place] MFM OB FOLLOW UP                   [phone]    Sander Nephew  2  [place] MFM FETAL BPP WO NON               [id number]    [facility]     STRESS                                            BOOKER ----------------------------------------------------------------------  #  Order #                     Accession #                Episode #  1  [id number]                   [id number]                 [id number]  2  [id number]                   [id number]                 [id number] ---------------------------------------------------------------------- Indications  Marginal insertion of umbilical cord affecting O43.193  management of mother in third trimester  Obesity complicating pregnancy, third          O99.213  trimester (BMI 50)  [redacted] weeks gestation of pregnancy                Z3A.35  ---------------------------------------------------------------------- Fetal Evaluation  Num Of Fetuses:         1  Fetal Heart Rate(bpm):  131  Cardiac Activity:       Observed  Presentation:           Breech  Placenta:               Posterior  P. Cord Insertion:      Marginal insertion  Amniotic Fluid  AFI FV:      Within normal limits  AFI Sum(cm)     %Tile       Largest Pocket(cm)  16.37           60          5.76  RUQ(cm)       RLQ(cm)       LUQ(cm)  LLQ(cm)  5.76          2.46          4.6            3.55 ---------------------------------------------------------------------- Biophysical Evaluation  Amniotic F.V:   Within normal limits       F. Tone:        Observed  F. Movement:    Observed                   Score:          8/8  F. Breathing:   Observed ---------------------------------------------------------------------- Biometry  BPD:      90.2  mm     G. Age:  36w 4d         85  %    CI:        72.55   %    70 - 86                                                          FL/HC:      19.1   %    20.1 - 22.3  HC:      336.8  mm     G. Age:  38w 4d         90  %    HC/AC:      1.09        0.93 - 1.11  AC:      309.4  mm     G. Age:  34w 6d         46  %    FL/BPD:     71.3   %    71 - 87  FL:       64.3  mm     G. Age:  33w 1d          5  %    FL/AC:      20.8   %    20 - 24  LV:        1.2  mm  Est. FW:    2559  gm    5 lb 10 oz      38  % ---------------------------------------------------------------------- Gestational Age  LMP:           38w 3d        Date:  04/11/21                 EDD:   01/16/22  U/S Today:     35w 6d                                        EDD:   02/03/22  Best:          35w 2d     Det. By:  U/S  (09/03/21)          EDD:   02/07/22 ---------------------------------------------------------------------- Anatomy  Cranium:               Appears normal         LVOT:                   Previously seen  Cavum:                 Previously seen        Aortic Arch:            Previously seen   Ventricles:            Appears normal         Ductal Arch:            Previously seen  Choroid Plexus:        Previously seen        Diaphragm:              Appears normal  Cerebellum:            Previously seen        Stomach:                Appears normal, left                                                                        sided  Posterior Fossa:       Previously seen        Abdomen:                Previously seen  Nuchal Fold:           Not applicable (Q000111Q    Abdominal Wall:         Previously seen                         wks GA)  Face:                  Orbits and profile     Cord Vessels:           Previously seen                         previously seen  Lips:                  Previously seen        Kidneys:                Appear normal  Palate:                Previously seen        Bladder:                Appears normal  Thoracic:              Appears normal         Spine:                  Previously seen  Heart:                 Previously seen        Upper Extremities:      Previously seen  RVOT:                  Previously seen        Lower Extremities:      Previously seen  Other:  Female gender  previously seen. Technically difficult due to maternal          habitus. Hands previously visualized. Heels/feet prev visualized. .          Nasal bone, lenses, VC, 3VV and 3VTV prev visualized. ---------------------------------------------------------------------- Cervix Uterus Adnexa  Cervix  Not visualized (advanced GA >24wks)  Uterus  No abnormality visualized.  Right Ovary  Within normal limits.  Left Ovary  Within normal limits.  Cul De Sac  No free fluid seen.  Adnexa  No adnexal mass visualized. ---------------------------------------------------------------------- Impression  Prepregnancy BMI 50.  Marginal cord insertion.  Fetal growth is appropriate for gestational age .Amniotic fluid  is normal and good fetal activity is seen .Antenatal testing is  reassuring. BPP 8/8.  ----------------------------------------------------------------------                  Tama High, MD Electronically Signed Final Report   01/05/2022 04:21 pm ----------------------------------------------------------------------  [place] MFM OB FOLLOW UP  Result Date: 01/05/2022 ----------------------------------------------------------------------  OBSTETRICS REPORT                       (Signed Final 01/05/2022 04:21 pm) ---------------------------------------------------------------------- Patient Info  ID #:       [id number]                          D.O.B.:  04/28/2001 (21 yrs)  Name:       Ashley Dillon                 Visit Date: 01/05/2022 11:15 am ---------------------------------------------------------------------- Performed By  Attending:        Tama High MD        Referred By:      Renee Harder  Performed By:     Germain Osgood            [facility]         [facility]                    RDMS                                     Fetal Care at                                                             [place] for                                                             Women ---------------------------------------------------------------------- Orders  #  Description  Code        Ordered By  1  [place] MFM OB FOLLOW UP                   [id number]    Sander Nephew  2  [place] MFM FETAL BPP WO NON               76819.01    [facility]     STRESS                                            BOOKER ----------------------------------------------------------------------  #  Order #                     Accession #                Episode #  1  [id number]                   [id number]                 [id number]  2  [id number]                   [id number]                 [id number] ---------------------------------------------------------------------- Indications  Marginal  insertion of umbilical cord affecting O43.193  management of mother in third trimester  Obesity complicating pregnancy, third          O99.213  trimester (BMI 50)  [redacted] weeks gestation of pregnancy                Z3A.35 ---------------------------------------------------------------------- Fetal Evaluation  Num Of Fetuses:         1  Fetal Heart Rate(bpm):  131  Cardiac Activity:       Observed  Presentation:           Breech  Placenta:               Posterior  P. Cord Insertion:      Marginal insertion  Amniotic Fluid  AFI FV:      Within normal limits  AFI Sum(cm)     %Tile       Largest Pocket(cm)  16.37           60          5.76  RUQ(cm)       RLQ(cm)       LUQ(cm)        LLQ(cm)  5.76          2.46          4.6            3.55 ---------------------------------------------------------------------- Biophysical Evaluation  Amniotic F.V:   Within normal limits       F. Tone:        Observed  F. Movement:    Observed                   Score:  8/8  F. Breathing:   Observed ---------------------------------------------------------------------- Biometry  BPD:      90.2  mm     G. Age:  36w 4d         85  %    CI:        72.55   %    70 - 86                                                          FL/HC:      19.1   %    20.1 - 22.3  HC:      336.8  mm     G. Age:  38w 4d         90  %    HC/AC:      1.09        0.93 - 1.11  AC:      309.4  mm     G. Age:  34w 6d         46  %    FL/BPD:     71.3   %    71 - 87  FL:       64.3  mm     G. Age:  33w 1d          5  %    FL/AC:      20.8   %    20 - 24  LV:        1.2  mm  Est. FW:    2559  gm    5 lb 10 oz      38  % ---------------------------------------------------------------------- Gestational Age  LMP:           38w 3d        Date:  04/11/21                 EDD:   01/16/22  U/S Today:     35w 6d                                        EDD:   02/03/22  Best:          35w 2d     Det. By:  U/S  (09/03/21)          EDD:   02/07/22  ---------------------------------------------------------------------- Anatomy  Cranium:               Appears normal         LVOT:                   Previously seen  Cavum:                 Previously seen        Aortic Arch:            Previously seen  Ventricles:            Appears normal         Ductal Arch:            Previously seen  Choroid Plexus:        Previously seen  Diaphragm:              Appears normal  Cerebellum:            Previously seen        Stomach:                Appears normal, left                                                                        sided  Posterior Fossa:       Previously seen        Abdomen:                Previously seen  Nuchal Fold:           Not applicable (Q000111Q    Abdominal Wall:         Previously seen                         wks GA)  Face:                  Orbits and profile     Cord Vessels:           Previously seen                         previously seen  Lips:                  Previously seen        Kidneys:                Appear normal  Palate:                Previously seen        Bladder:                Appears normal  Thoracic:              Appears normal         Spine:                  Previously seen  Heart:                 Previously seen        Upper Extremities:      Previously seen  RVOT:                  Previously seen        Lower Extremities:      Previously seen  Other:  Female gender previously seen. Technically difficult due to maternal          habitus. Hands previously visualized. Heels/feet prev visualized. .          Nasal bone, lenses, VC, 3VV and 3VTV prev visualized. ---------------------------------------------------------------------- Cervix Uterus Adnexa  Cervix  Not visualized (advanced GA >24wks)  Uterus  No abnormality visualized.  Right Ovary  Within normal limits.  Left Ovary  Within normal limits.  Cul De Sac  No free fluid seen.  Adnexa  No adnexal mass visualized.  ---------------------------------------------------------------------- Impression  Prepregnancy BMI 50.  Marginal  cord insertion.  Fetal growth is appropriate for gestational age .Amniotic fluid  is normal and good fetal activity is seen .Antenatal testing is  reassuring. BPP 8/8. ----------------------------------------------------------------------                  Tama High, MD Electronically Signed Final Report   01/05/2022 04:21 pm ----------------------------------------------------------------------    Assessment and Plan: Patient Active Problem List   Diagnosis Date Noted   Breech presentation of fetus 01/26/2022   Breech presentation, no version 01/22/2022   Morbid obesity ([place]) [id number]   Obesity complicating pregnancy, childbirth, or puerperium, antepartum 12/25/2021   Marginal insertion of umbilical cord affecting management of mother 11/19/2021   Supervision of high risk pregnancy, antepartum 08/13/2021   Admit to Antenatal Current fetal tracing is reassuring NPO at midnight Pt scheduled for primary cesarean section in the AM Continuous fetal monitoring   Lynnda Shields, MD Faculty attending, Center for [facility]

## 2022-01-26 NOTE — Patient Instructions (Addendum)
Ashley Dillon  01/26/2022   Your procedure is scheduled on:  01/31/2022  Arrive at 0630 at Entrance C on [employer] at [facility]  and [employer]. You are invited to use the FREE valet parking or use the Visitor's parking deck.  Pick up the phone at the desk and dial [phone].  Call this number if you have problems the morning of surgery: [phone]  Remember:   Do not eat food:(After Midnight) Desps de medianoche.  Do not drink clear liquids: (After Midnight) Desps de medianoche.  Take these medicines the morning of surgery with A SIP OF WATER:  none   Do not wear jewelry, make-up or nail polish.  Do not wear lotions, powders, or perfumes. Do not wear deodorant.  Do not shave 48 hours prior to surgery.  Do not bring valuables to the hospital.  [facility] is not   responsible for any belongings or valuables brought to the hospital.  Contacts, dentures or bridgework may not be worn into surgery.  Leave suitcase in the car. After surgery it may be brought to your room.  For patients admitted to the hospital, checkout time is 11:00 AM the day of              discharge.      Please read over the following fact sheets that you were given:     Preparing for Surgery

## 2022-01-27 ENCOUNTER — Encounter: Payer: Self-pay | Admitting: Family Medicine

## 2022-01-27 ENCOUNTER — Encounter (HOSPITAL_COMMUNITY): Payer: Self-pay | Admitting: Obstetrics and Gynecology

## 2022-01-27 ENCOUNTER — Inpatient Hospital Stay (HOSPITAL_COMMUNITY): Payer: Medicaid Other | Admitting: Anesthesiology

## 2022-01-27 ENCOUNTER — Encounter (HOSPITAL_COMMUNITY)
Admission: AD | Disposition: A | Discharge status: Home / Self Care | Payer: Self-pay | Source: Home / Self Care | Attending: Family Medicine

## 2022-01-27 DIAGNOSIS — Z98891 History of uterine scar from previous surgery: Secondary | ICD-10-CM

## 2022-01-27 DIAGNOSIS — Z3A38 38 weeks gestation of pregnancy: Secondary | ICD-10-CM

## 2022-01-27 DIAGNOSIS — O321XX0 Maternal care for breech presentation, not applicable or unspecified: Secondary | ICD-10-CM

## 2022-01-27 DIAGNOSIS — Z30430 Encounter for insertion of intrauterine contraceptive device: Secondary | ICD-10-CM

## 2022-01-27 DIAGNOSIS — O321XX1 Maternal care for breech presentation, fetus 1: Secondary | ICD-10-CM

## 2022-01-27 LAB — COMPREHENSIVE METABOLIC PANEL
ALT: 12 U/L (ref 0–44)
AST: 14 U/L — ABNORMAL LOW (ref 15–41)
Albumin: 2.6 g/dL — ABNORMAL LOW (ref 3.5–5.0)
Alkaline Phosphatase: 128 U/L — ABNORMAL HIGH (ref 38–126)
Anion gap: 9 (ref 5–15)
BUN: 8 mg/dL (ref 6–20)
CO2: 19 mmol/L — ABNORMAL LOW (ref 22–32)
Calcium: 8.6 mg/dL — ABNORMAL LOW (ref 8.9–10.3)
Chloride: 107 mmol/L (ref 98–111)
Creatinine, Ser: 0.85 mg/dL (ref 0.44–1.00)
GFR, Estimated: >60 mL/min (ref >60)
Glucose, Bld: 95 mg/dL (ref 70–99)
Potassium: 3.7 mmol/L (ref 3.5–5.1)
Sodium: 135 mmol/L (ref 135–145)
Total Bilirubin: 0.4 mg/dL (ref 0.3–1.2)
Total Protein: 6.7 g/dL (ref 6.5–8.1)

## 2022-01-27 LAB — CBC
HCT: 35.9 % — ABNORMAL LOW (ref 36.0–46.0)
Hemoglobin: 11.7 g/dL — ABNORMAL LOW (ref 12.0–15.0)
MCH: 30.9 pg (ref 26.0–34.0)
MCHC: 32.6 g/dL (ref 30.0–36.0)
MCV: 94.7 fL (ref 80.0–100.0)
Platelets: 278 10*3/uL (ref 150–400)
RBC: 3.79 MIL/uL — ABNORMAL LOW (ref 3.87–5.11)
RDW: 12.4 % (ref 11.5–15.5)
WBC: 11.8 10*3/uL — ABNORMAL HIGH (ref 4.0–10.5)
nRBC: 0 % (ref 0.0–0.2)

## 2022-01-27 LAB — RPR: RPR Ser Ql: NONREACTIVE

## 2022-01-27 LAB — TYPE AND SCREEN
ABO/RH(D): B POS
Antibody Screen: NEGATIVE

## 2022-01-27 SURGERY — Surgical Case
Anesthesia: Spinal

## 2022-01-27 MED ORDER — FENTANYL CITRATE (PF) 100 MCG/2ML IJ SOLN
INTRAMUSCULAR | Status: AC
  Filled 2022-01-27: qty 2

## 2022-01-27 MED ORDER — KETOROLAC TROMETHAMINE 30 MG/ML IJ SOLN
30.0000 mg | Freq: Four times a day (QID) | INTRAMUSCULAR | Status: AC | PRN
  Administered 2022-01-27 (×2): 30 mg via INTRAVENOUS

## 2022-01-27 MED ORDER — LACTATED RINGERS IV SOLN
120.0000 mL/h | INTRAVENOUS | Status: DC
Start: 2022-01-27 — End: 2022-01-27
  Administered 2022-01-27: 120 mL/h via INTRAVENOUS

## 2022-01-27 MED ORDER — DEXTROSE 5 % IV SOLN
INTRAVENOUS | Status: DC | PRN
  Administered 2022-01-27: 3 g via INTRAVENOUS

## 2022-01-27 MED ORDER — PHENYLEPHRINE HCL-NACL 20-0.9 MG/250ML-% IV SOLN
INTRAVENOUS | Status: DC | PRN
  Administered 2022-01-27: 60 ug/min via INTRAVENOUS

## 2022-01-27 MED ORDER — DIPHENHYDRAMINE HCL 25 MG PO CAPS
25.0000 mg | ORAL_CAPSULE | ORAL | Status: DC | PRN
Start: 2022-01-27 — End: 2022-01-29

## 2022-01-27 MED ORDER — SOD CITRATE-CITRIC ACID 500-334 MG/5ML PO SOLN
30.0000 mL | ORAL | Status: AC
  Administered 2022-01-27: 30 mL via ORAL
  Filled 2022-01-27: qty 30

## 2022-01-27 MED ORDER — POVIDONE-IODINE 10 % EX SWAB
2.0000 "application " | Freq: Once | CUTANEOUS | Status: AC
  Administered 2022-01-27: 2 via TOPICAL

## 2022-01-27 MED ORDER — ONDANSETRON HCL 4 MG/2ML IJ SOLN
INTRAMUSCULAR | Status: DC | PRN
Start: 2022-01-27 — End: 2022-01-27
  Administered 2022-01-27: 4 mg via INTRAVENOUS

## 2022-01-27 MED ORDER — KETOROLAC TROMETHAMINE 30 MG/ML IJ SOLN
INTRAMUSCULAR | Status: AC
  Filled 2022-01-27: qty 1

## 2022-01-27 MED ORDER — OXYTOCIN-SODIUM CHLORIDE 30-0.9 UT/500ML-% IV SOLN
INTRAVENOUS | Status: DC | PRN
  Administered 2022-01-27: 200 mL via INTRAVENOUS

## 2022-01-27 MED ORDER — ACETAMINOPHEN 10 MG/ML IV SOLN
INTRAVENOUS | Status: AC
  Filled 2022-01-27: qty 100

## 2022-01-27 MED ORDER — KETOROLAC TROMETHAMINE 30 MG/ML IJ SOLN
30.0000 mg | Freq: Four times a day (QID) | INTRAMUSCULAR | Status: AC
  Administered 2022-01-27 – 2022-01-28 (×2): 30 mg via INTRAVENOUS
  Filled 2022-01-27 (×3): qty 1

## 2022-01-27 MED ORDER — SENNOSIDES-DOCUSATE SODIUM 8.6-50 MG PO TABS
2.0000 | ORAL_TABLET | Freq: Every day | ORAL | Status: DC
  Administered 2022-01-28: 2 via ORAL
  Filled 2022-01-27 (×2): qty 2

## 2022-01-27 MED ORDER — SCOPOLAMINE 1 MG/3DAYS TD PT72: 1.0000 | MEDICATED_PATCH | Freq: Once | TRANSDERMAL | Status: DC

## 2022-01-27 MED ORDER — PRENATAL MULTIVITAMIN CH
1.0000 | ORAL_TABLET | Freq: Every day | ORAL | Status: DC
  Administered 2022-01-28 – 2022-01-29 (×2): 1 via ORAL
  Filled 2022-01-27 (×2): qty 1

## 2022-01-27 MED ORDER — LEVONORGESTREL 20.1 MCG/DAY IU IUD: 1.0000 | INTRAUTERINE_SYSTEM | INTRAUTERINE | Status: DC

## 2022-01-27 MED ORDER — ONDANSETRON HCL 4 MG/2ML IJ SOLN: 4.0000 mg | Freq: Three times a day (TID) | INTRAMUSCULAR | Status: DC | PRN

## 2022-01-27 MED ORDER — DIPHENHYDRAMINE HCL 50 MG/ML IJ SOLN: 12.5000 mg | INTRAMUSCULAR | Status: DC | PRN

## 2022-01-27 MED ORDER — ENOXAPARIN SODIUM 80 MG/0.8ML IJ SOSY
80.0000 mg | PREFILLED_SYRINGE | INTRAMUSCULAR | Status: DC
  Administered 2022-01-28 – 2022-01-29 (×2): 80 mg via SUBCUTANEOUS
  Filled 2022-01-27 (×2): qty 0.8

## 2022-01-27 MED ORDER — LEVONORGESTREL 20.1 MCG/DAY IU IUD
1.0000 | INTRAUTERINE_SYSTEM | Freq: Once | INTRAUTERINE | Status: AC
  Administered 2022-01-27: 1 via INTRAUTERINE
  Filled 2022-01-27: qty 1

## 2022-01-27 MED ORDER — NALOXONE HCL 0.4 MG/ML IJ SOLN: 0.4000 mg | INTRAMUSCULAR | Status: DC | PRN

## 2022-01-27 MED ORDER — DEXAMETHASONE SODIUM PHOSPHATE 4 MG/ML IJ SOLN
INTRAMUSCULAR | Status: AC
  Filled 2022-01-27: qty 1

## 2022-01-27 MED ORDER — NALOXONE HCL 4 MG/10ML IJ SOLN
1.0000 ug/kg/h | INTRAVENOUS | Status: DC | PRN
  Filled 2022-01-27: qty 5

## 2022-01-27 MED ORDER — ACETAMINOPHEN 10 MG/ML IV SOLN
INTRAVENOUS | Status: DC | PRN
  Administered 2022-01-27: 1000 mg via INTRAVENOUS

## 2022-01-27 MED ORDER — ACETAMINOPHEN 500 MG PO TABS
1000.0000 mg | ORAL_TABLET | Freq: Four times a day (QID) | ORAL | Status: AC
  Administered 2022-01-27 – 2022-01-28 (×4): 1000 mg via ORAL
  Filled 2022-01-27 (×4): qty 2

## 2022-01-27 MED ORDER — CEFAZOLIN IN SODIUM CHLORIDE 3-0.9 GM/100ML-% IV SOLN: 3.0000 g | INTRAVENOUS | Status: DC

## 2022-01-27 MED ORDER — MENTHOL 3 MG MT LOZG: 1.0000 | LOZENGE | OROMUCOSAL | Status: DC | PRN

## 2022-01-27 MED ORDER — IBUPROFEN 600 MG PO TABS
600.0000 mg | ORAL_TABLET | Freq: Four times a day (QID) | ORAL | Status: DC
  Administered 2022-01-28 – 2022-01-29 (×5): 600 mg via ORAL
  Filled 2022-01-27 (×5): qty 1

## 2022-01-27 MED ORDER — OXYTOCIN-SODIUM CHLORIDE 30-0.9 UT/500ML-% IV SOLN
INTRAVENOUS | Status: AC
  Filled 2022-01-27: qty 500

## 2022-01-27 MED ORDER — DIBUCAINE (PERIANAL) 1 % EX OINT: 1.0000 "application " | TOPICAL_OINTMENT | CUTANEOUS | Status: DC | PRN

## 2022-01-27 MED ORDER — FENTANYL CITRATE (PF) 100 MCG/2ML IJ SOLN: 25.0000 ug | INTRAMUSCULAR | Status: DC | PRN

## 2022-01-27 MED ORDER — MORPHINE SULFATE (PF) 0.5 MG/ML IJ SOLN
INTRAMUSCULAR | Status: AC
  Filled 2022-01-27: qty 10

## 2022-01-27 MED ORDER — ONDANSETRON HCL 4 MG/2ML IJ SOLN
INTRAMUSCULAR | Status: AC
  Filled 2022-01-27: qty 2

## 2022-01-27 MED ORDER — BUPIVACAINE IN DEXTROSE 0.75-8.25 % IT SOLN
INTRATHECAL | Status: DC | PRN
  Administered 2022-01-27: 1.8 mL via INTRATHECAL

## 2022-01-27 MED ORDER — DIPHENHYDRAMINE HCL 25 MG PO CAPS: 25.0000 mg | ORAL_CAPSULE | Freq: Four times a day (QID) | ORAL | Status: DC | PRN

## 2022-01-27 MED ORDER — STERILE WATER FOR IRRIGATION IR SOLN
Status: DC | PRN
Start: 2022-01-27 — End: 2022-01-27
  Administered 2022-01-27: 1

## 2022-01-27 MED ORDER — CEFAZOLIN IN SODIUM CHLORIDE 3-0.9 GM/100ML-% IV SOLN
INTRAVENOUS | Status: AC
  Filled 2022-01-27: qty 100

## 2022-01-27 MED ORDER — SIMETHICONE 80 MG PO CHEW: 80.0000 mg | CHEWABLE_TABLET | ORAL | Status: DC | PRN

## 2022-01-27 MED ORDER — DEXAMETHASONE SODIUM PHOSPHATE 4 MG/ML IJ SOLN
INTRAMUSCULAR | Status: DC | PRN
  Administered 2022-01-27: 4 mg via INTRAVENOUS

## 2022-01-27 MED ORDER — LEVONORGESTREL 20.1 MCG/DAY IU IUD
INTRAUTERINE_SYSTEM | INTRAUTERINE | Status: AC
  Filled 2022-01-27: qty 1

## 2022-01-27 MED ORDER — ACETAMINOPHEN 10 MG/ML IV SOLN: 1000.0000 mg | Freq: Once | INTRAVENOUS | Status: DC | PRN

## 2022-01-27 MED ORDER — MORPHINE SULFATE (PF) 0.5 MG/ML IJ SOLN
INTRAMUSCULAR | Status: DC | PRN
  Administered 2022-01-27: .15 mg via INTRATHECAL

## 2022-01-27 MED ORDER — LACTATED RINGERS IV SOLN: INTRAVENOUS | Status: DC

## 2022-01-27 MED ORDER — COCONUT OIL OIL
1.0000 "application " | TOPICAL_OIL | Status: DC | PRN
  Administered 2022-01-27: 1 via TOPICAL

## 2022-01-27 MED ORDER — WITCH HAZEL-GLYCERIN EX PADS: 1.0000 "application " | MEDICATED_PAD | CUTANEOUS | Status: DC | PRN

## 2022-01-27 MED ORDER — ACETAMINOPHEN 500 MG PO TABS: 1000.0000 mg | ORAL_TABLET | Freq: Four times a day (QID) | ORAL | Status: DC

## 2022-01-27 MED ORDER — KETOROLAC TROMETHAMINE 30 MG/ML IJ SOLN: 30.0000 mg | Freq: Four times a day (QID) | INTRAMUSCULAR | Status: AC | PRN

## 2022-01-27 MED ORDER — TETANUS-DIPHTH-ACELL PERTUSSIS 5-2.5-18.5 LF-MCG/0.5 IM SUSY: 0.5000 mL | PREFILLED_SYRINGE | Freq: Once | INTRAMUSCULAR | Status: DC

## 2022-01-27 MED ORDER — OXYTOCIN-SODIUM CHLORIDE 30-0.9 UT/500ML-% IV SOLN
2.5000 [IU]/h | INTRAVENOUS | Status: AC
  Administered 2022-01-27: 2.5 [IU]/h via INTRAVENOUS
  Filled 2022-01-27: qty 500

## 2022-01-27 MED ORDER — FENTANYL CITRATE (PF) 100 MCG/2ML IJ SOLN
INTRAMUSCULAR | Status: DC | PRN
  Administered 2022-01-27: 15 ug via INTRATHECAL

## 2022-01-27 MED ORDER — OXYTOCIN 10 UNIT/ML IJ SOLN
INTRAMUSCULAR | Status: AC
  Filled 2022-01-27: qty 4

## 2022-01-27 MED ORDER — CEFAZOLIN IN SODIUM CHLORIDE 3-0.9 GM/100ML-% IV SOLN
3.0000 g | INTRAVENOUS | Status: DC
  Filled 2022-01-27: qty 100

## 2022-01-27 MED ORDER — SIMETHICONE 80 MG PO CHEW
80.0000 mg | CHEWABLE_TABLET | Freq: Three times a day (TID) | ORAL | Status: DC
  Administered 2022-01-27 – 2022-01-29 (×5): 80 mg via ORAL
  Filled 2022-01-27 (×5): qty 1

## 2022-01-27 MED ORDER — OXYCODONE HCL 5 MG PO TABS
5.0000 mg | ORAL_TABLET | ORAL | Status: DC | PRN
  Administered 2022-01-29: 5 mg via ORAL
  Filled 2022-01-27: qty 1

## 2022-01-27 MED ORDER — ZOLPIDEM TARTRATE 5 MG PO TABS: 5.0000 mg | ORAL_TABLET | Freq: Every evening | ORAL | Status: DC | PRN

## 2022-01-27 MED ORDER — MEASLES, MUMPS & RUBELLA VAC IJ SOLR: 0.5000 mL | Freq: Once | INTRAMUSCULAR | Status: DC

## 2022-01-27 MED ORDER — SODIUM CHLORIDE 0.9% FLUSH: 3.0000 mL | INTRAVENOUS | Status: DC | PRN

## 2022-01-27 SURGICAL SUPPLY — 42 items
CHLORAPREP W/TINT 26ML (MISCELLANEOUS) ×4 IMPLANT
CLAMP CORD UMBIL (MISCELLANEOUS) ×1 IMPLANT
CLOTH BEACON ORANGE TIMEOUT ST (SAFETY) ×2 IMPLANT
DERMABOND ADVANCED (GAUZE/BANDAGES/DRESSINGS) ×1
DERMABOND ADVANCED .7 DNX12 (GAUZE/BANDAGES/DRESSINGS) ×2 IMPLANT
DRSG OPSITE POSTOP 4X10 (GAUZE/BANDAGES/DRESSINGS) ×2 IMPLANT
ELECT REM PT RETURN 9FT ADLT (ELECTROSURGICAL) ×2
ELECTRODE REM PT RTRN 9FT ADLT (ELECTROSURGICAL) ×1 IMPLANT
EXTRACTOR VACUUM BELL STYLE (SUCTIONS) IMPLANT
GLOVE BIOGEL PI IND STRL 7.0 (GLOVE) ×1 IMPLANT
GLOVE BIOGEL PI IND STRL 8 (GLOVE) ×1 IMPLANT
GLOVE BIOGEL PI INDICATOR 7.0 (GLOVE) ×1
GLOVE BIOGEL PI INDICATOR 8 (GLOVE) ×1
GLOVE ECLIPSE 8.0 STRL XLNG CF (GLOVE) ×2 IMPLANT
GOWN STRL REUS W/TWL LRG LVL3 (GOWN DISPOSABLE) ×4 IMPLANT
KIT ABG SYR 3ML LUER SLIP (SYRINGE) ×2 IMPLANT
MAT PREVALON FULL STRYKER (MISCELLANEOUS) ×1 IMPLANT
NDL HYPO 18GX1.5 BLUNT FILL (NEEDLE) ×1 IMPLANT
NDL HYPO 25X5/8 SAFETYGLIDE (NEEDLE) ×1 IMPLANT
NEEDLE HYPO 18GX1.5 BLUNT FILL (NEEDLE) ×2 IMPLANT
NEEDLE HYPO 22GX1.5 SAFETY (NEEDLE) ×2 IMPLANT
NEEDLE HYPO 25X5/8 SAFETYGLIDE (NEEDLE) ×2 IMPLANT
NS IRRIG 1000ML POUR BTL (IV SOLUTION) ×2 IMPLANT
PACK C SECTION WH (CUSTOM PROCEDURE TRAY) ×2 IMPLANT
PAD OB MATERNITY 4.3X12.25 (PERSONAL CARE ITEMS) ×2 IMPLANT
PENCIL SMOKE EVAC W/HOLSTER (ELECTROSURGICAL) ×2 IMPLANT
RETRACTOR TRAXI PANNICULUS (MISCELLANEOUS) IMPLANT
RTRCTR C-SECT PINK 25CM LRG (MISCELLANEOUS) IMPLANT
SUT CHROMIC 0 CT 1 (SUTURE) ×2 IMPLANT
SUT MNCRL 0 VIOLET CTX 36 (SUTURE) ×2 IMPLANT
SUT MONOCRYL 0 CTX 36 (SUTURE) ×2
SUT PLAIN 2 0 (SUTURE)
SUT PLAIN 2 0 XLH (SUTURE) IMPLANT
SUT PLAIN ABS 2-0 CT1 27XMFL (SUTURE) IMPLANT
SUT VIC AB 0 CTX 36 (SUTURE) ×1
SUT VIC AB 0 CTX36XBRD ANBCTRL (SUTURE) ×1 IMPLANT
SUT VIC AB 4-0 KS 27 (SUTURE) IMPLANT
SYR 20CC LL (SYRINGE) ×4 IMPLANT
TOWEL OR 17X24 6PK STRL BLUE (TOWEL DISPOSABLE) ×2 IMPLANT
TRAXI PANNICULUS RETRACTOR (MISCELLANEOUS) ×1
TRAY FOLEY W/BAG SLVR 14FR LF (SET/KITS/TRAYS/PACK) IMPLANT
WATER STERILE IRR 1000ML POUR (IV SOLUTION) ×2 IMPLANT

## 2022-01-27 NOTE — Anesthesia Postprocedure Evaluation (Signed)
Anesthesia Post Note  Patient: Ashley Dillon  Procedure(s) Performed: CESAREAN SECTION     Patient location during evaluation: PACU Anesthesia Type: Spinal Level of consciousness: oriented and awake and alert Pain management: pain level controlled Vital Signs Assessment: post-procedure vital signs reviewed and stable Respiratory status: spontaneous breathing, respiratory function stable and patient connected to nasal cannula oxygen Cardiovascular status: blood pressure returned to baseline and stable Postop Assessment: no headache, no backache and no apparent nausea or vomiting Anesthetic complications: no   No notable events documented.  Last Vitals:  Vitals:   01/27/22 1215 01/27/22 1312  BP: (!) 123/55 (!) 91/47  Pulse: 71 70  Resp: 18 18  Temp: 36.6 C (!) 36.4 C  SpO2: 100% 99%    Last Pain:  Vitals:   01/27/22 1312  TempSrc: Oral  PainSc:    Pain Goal:                   Bettylou Frew L Blas Riches

## 2022-01-27 NOTE — Anesthesia Preprocedure Evaluation (Addendum)
Anesthesia Evaluation  Patient identified by MRN, date of birth, ID band Patient awake    Reviewed: Allergy & Precautions, NPO status , Patient's Chart, lab work & pertinent test results  Airway Mallampati: III  TM Distance: >3 FB Neck ROM: Full    Dental no notable dental hx. (+) Teeth Intact, Dental Advisory Given   Pulmonary neg pulmonary ROS,    Pulmonary exam normal breath sounds clear to auscultation       Cardiovascular negative cardio ROS Normal cardiovascular exam Rhythm:Regular Rate:Normal     Neuro/Psych  Headaches, negative psych ROS   GI/Hepatic negative GI ROS, Neg liver ROS,   Endo/Other  Morbid obesity (BMI 59)  Renal/GU negative Renal ROS  negative genitourinary   Musculoskeletal negative musculoskeletal ROS (+)   Abdominal   Peds  Hematology negative hematology ROS (+)   Anesthesia Other Findings 21 y.o. G1P0 at [redacted]w[redacted]d admitted for BPP of 4/8 at term and breech presentation  Reproductive/Obstetrics (+) Pregnancy                            Anesthesia Physical Anesthesia Plan  ASA: 3  Anesthesia Plan: Spinal   Post-op Pain Management: Ofirmev IV (intra-op)* and Toradol IV (intra-op)*   Induction:   PONV Risk Score and Plan: 2 and Treatment may vary due to age or medical condition  Airway Management Planned: Natural Airway  Additional Equipment:   Intra-op Plan:   Post-operative Plan:   Informed Consent: I have reviewed the patients History and Physical, chart, labs and discussed the procedure including the risks, benefits and alternatives for the proposed anesthesia with the patient or authorized representative who has indicated his/her understanding and acceptance.     Dental advisory given  Plan Discussed with: CRNA  Anesthesia Plan Comments:        Anesthesia Quick Evaluation

## 2022-01-27 NOTE — Anesthesia Procedure Notes (Signed)
Spinal  Patient location during procedure: OR Start time: 01/27/2022 9:40 AM End time: 01/27/2022 9:44 AM Reason for block: surgical anesthesia Staffing Performed: anesthesiologist  Anesthesiologist: Elmer Picker, MD Preanesthetic Checklist Completed: patient identified, IV checked, risks and benefits discussed, surgical consent, monitors and equipment checked, pre-op evaluation and timeout performed Spinal Block Patient position: sitting Prep: DuraPrep and site prepped and draped Patient monitoring: cardiac monitor, continuous pulse ox and blood pressure Approach: midline Location: L3-4 Injection technique: single-shot Needle Needle type: Pencan  Needle gauge: 24 G Needle length: 9 cm Assessment Sensory level: T6 Events: CSF return Additional Notes Functioning IV was confirmed and monitors were applied. Sterile prep and drape, including hand hygiene and sterile gloves were used. The patient was positioned and the spine was prepped. The skin was anesthetized with lidocaine.  Free flow of clear CSF was obtained prior to injecting local anesthetic into the CSF.  The spinal needle aspirated freely following injection.  The needle was carefully withdrawn.  The patient tolerated the procedure well.

## 2022-01-27 NOTE — Op Note (Addendum)
Jacky Kindle PROCEDURE DATE: 01/27/2022  PREOPERATIVE DIAGNOSES: Intrauterine pregnancy at [redacted]w[redacted]d weeks gestation; malpresentation: frank breech ; long-term reversible contraception desired  POSTOPERATIVE DIAGNOSES: The same  PROCEDURE: Primary Low Transverse Cesarean Section with postplacental IUD placement  SURGEON:  Jen Mow, DO; Candelaria Celeste, DO  ASSISTANT: Jen Mow, DO  ANESTHESIOLOGY TEAM: Anesthesiologist: Elmer Picker, MD CRNA: Rhymer, Doree Fudge, CRNA  INDICATIONS: Ashley Dillon is a 21 y.o. G1P1001 at [redacted]w[redacted]d here for cesarean section secondary to the indications listed under preoperative diagnoses; please see preoperative note for further details.  The risks of cesarean section were discussed with the patient including but were not limited to: bleeding which may require transfusion or reoperation; infection which may require antibiotics; injury to bowel, bladder, ureters or other surrounding organs; injury to the fetus; need for additional procedures including hysterectomy in the event of a life-threatening hemorrhage; placental abnormalities wth subsequent pregnancies, incisional problems, thromboembolic phenomenon and other postoperative/anesthesia complications.   The patient concurred with the proposed plan, giving informed written consent for the procedure.    FINDINGS:  Viable female infant in frank breech presentation.  Apgars 7 and 8.  Clear amniotic fluid.  Intact placenta, three vessel cord.  Normal uterus, fallopian tubes and ovaries bilaterally. Postplacental IUD placed with ease, confirmed position of placement.  IUD: Allayne Gitelman 21051-01, Expir: 11/2024  ANESTHESIA: Spinal  INTRAVENOUS FLUIDS: 1300 ml   ESTIMATED BLOOD LOSS: 557 ml URINE OUTPUT:  100 ml, clear yellow SPECIMENS: Placenta sent to L&D COMPLICATIONS: None immediate  PROCEDURE IN DETAIL:  The patient preoperatively received intravenous antibiotics and had sequential compression  devices applied to her lower extremities.  She was then taken to the operating room where spinal anesthesia was administered and was found to be adequate. She was then placed in a dorsal supine position with a leftward tilt, and prepped and draped in a sterile manner.  A foley catheter was placed into her bladder and attached to constant gravity.  After an adequate timeout was performed, a Pfannenstiel skin incision was made with scalpel and carried through to the underlying layer of fascia. The fascia was incised in the midline, and this incision was extended bilaterally using the Mayo scissors.  Kocher clamps were applied to the superior aspect of the fascial incision and the underlying rectus muscles were dissected off bluntly and sharply. The rectus muscles were separated in the midline and the peritoneum was entered bluntly. The Alexis self-retaining retractor was introduced into the abdominal cavity.  Attention was turned to the lower uterine segment where a low transverse hysterotomy was made with a scalpel and extended bilaterally bluntly.  The infant was successfully delivered via frank breech presentation, the cord was clamped and cut after one minute, and the infant was handed over to the awaiting neonatology team. Uterine massage was then administered, and the placenta delivered intact with a three-vessel cord. The uterus was then cleared of clots and debris.    A postplacental IUD (Liletta) was inserted with 10cm pre-cut string passed through cervix with Kelly forceps. The hysterotomy was closed with 0 Vicryl in a running locked fashion in a single layer.  The pelvis was cleared of all clot and debris. Hemostasis was confirmed on all surfaces.  The retractor was removed.  The peritoneum was closed with a 2-0 Vicryl running stitch. The fascia was then closed using 0 Vicryl in a running fashion.  The subcutaneous layer was irrigated, reapproximated with 2-0 plain gut running stitches, and the skin was  closed  with a 4-0 Vicryl subcuticular stitch. Dermabond was applied over incision, followed by honeycomb dressing to be removed on day #5. The patient tolerated the procedure well. Sponge, instrument and needle counts were correct x 3.  She was taken to the recovery room in stable condition.    Jen Mow, DO OB Faculty Practice Center for [employer], [facility]

## 2022-01-27 NOTE — Discharge Summary (Addendum)
Postpartum Discharge Summary     Patient Name: Ashley Dillon DOB: 10-03-2001 MRN: [id number]  Date of admission: 01/26/2022 Delivery date:01/27/2022  Delivering provider: Truett Mainland  Date of discharge: 01/29/2022  Admitting diagnosis: Breech presentation of fetus [O32.1XX0] S/P primary low transverse C-section [Z98.891] Intrauterine pregnancy: [redacted]w[redacted]d    Secondary diagnosis:  Principal Problem:   S/P primary low transverse C-section Active Problems:   Supervision of high risk pregnancy, antepartum   Marginal insertion of umbilical cord affecting management of mother   Obesity complicating pregnancy, childbirth, or puerperium, antepartum   Breech presentation of fetus  Additional problems: NRFHT   Discharge diagnosis: Term Pregnancy Delivered and primary cesarean for breech presentation                                               Post partum procedures: None Augmentation: N/A Complications: None  Hospital course: Scheduled C/S - 21y.o. yo G1P1001 at 319w3das admitted to the hospital 01/26/2022 for scheduled cesarean section with the following indication: Malpresentation and Non-Reassuring FHR. Delivery details are as follows:  Membrane Rupture Time/Date: 10:15 AM ,01/27/2022   Delivery Method:C-Section, Low Transverse  Details of operation can be found in separate operative note. Patient had an uncomplicated postpartum course. Her hemoglobin on POD#1 was 8.7, down from 11.7, for which she received IV Venofer. She remained asymptomatic from her anemia during her stay. She is ambulating, tolerating a regular diet, passing flatus, and urinating well. Patient is discharged home in stable condition on  01/29/22.        Newborn Data: Birth date:01/27/2022  Birth time:10:17 AM  Gender:Female  Living status:Living  Apgars:7 ,8  Weight:3380 g     Magnesium Sulfate received: No BMZ received: No Rhophylac: N/A MMR: N/A T-DaP: Given prenatally Flu: No Transfusion:  No  Physical exam  Vitals:   01/28/22 0500 01/28/22 1400 01/28/22 2140 01/29/22 0530  BP: 119/63 (!) 111/47 122/68 121/67  Pulse: 78 82 87 100  Resp: _0 Temp: 98.1 F (36.7 C) 99.1 F (37.3 C) 98 F (36.7 C) 98 F (36.7 C)  TempSrc: Oral Oral Oral Oral  SpO2: 100% 100% 100% 100%  Weight:      Height:       General: alert, cooperative, and no distress Heart: Regular rate, warm and well-perfused Lungs: Normal work of breathing on room air Uterine Fundus: Firm and below umbilicus  Incision: Healing well with no significant drainage, No significant erythema, Dressing is clean, dry, and intact DVT Evaluation: No LE edema, no calf tenderness to palpation  Labs: Lab Results  Component Value Date   WBC 15.8 (H) 01/28/2022   HGB 8.7 (L) 01/28/2022   HCT 26.9 (L) 01/28/2022   MCV 95.7 01/28/2022   PLT 217 01/28/2022   CMP Latest Ref Rng & Units 01/26/2022  Glucose 70 - 99 mg/dL 95  BUN 6 - 20 mg/dL 8  Creatinine 0.44 - 1.00 mg/dL 0.85  Sodium 135 - 145 mmol/L 135  Potassium 3.5 - 5.1 mmol/L 3.7  Chloride 98 - 111 mmol/L 107  CO2 22 - 32 mmol/L 19(L)  Calcium 8.9 - 10.3 mg/dL 8.6(L)  Total Protein 6.5 - 8.1 g/dL 6.7  Total Bilirubin 0.3 - 1.2 mg/dL 0.4  Alkaline Phos 38 - 126 U/L 128(H)  AST 15 - 41 U/L 14(L)  ALT  0 - 44 U/L 12   Edinburgh Score: Edinburgh Postnatal Depression Scale Screening Tool 01/28/2022  I have been able to laugh and see the funny side of things. 0  I have looked forward with enjoyment to things. 0  I have blamed myself unnecessarily when things went wrong. 0  I have been anxious or worried for no good reason. 2  I have felt scared or panicky for no good reason. 0  Things have been getting on top of me. 0  I have been so unhappy that I have had difficulty sleeping. 0  I have felt sad or miserable. 0  I have been so unhappy that I have been crying. 0  The thought of harming myself has occurred to me. 0  Edinburgh Postnatal Depression  Scale Total 2     After visit meds:  Allergies as of 01/29/2022   No Known Allergies      Medication List     TAKE these medications    acetaminophen 500 MG tablet Commonly known as: TYLENOL Take 2 tablets (1,000 mg total) by mouth every 8 (eight) hours as needed (pain).   ibuprofen 600 MG tablet Commonly known as: ADVIL Take 1 tablet (600 mg total) by mouth every 6 (six) hours as needed (pain).   oxyCODONE 5 MG immediate release tablet Commonly known as: Oxy IR/ROXICODONE Take 1 tablet (5 mg total) by mouth every 6 (six) hours as needed for severe pain or breakthrough pain.               Discharge Care Instructions  (From admission, onward)           Start     Ordered   01/29/22 0000  Discharge wound care:       Comments: Remove dressing 5 days after your surgery date. You can then wash gently with soap and water in the shower and pat dry. You will have an incision check in about 1 week.   01/29/22 0625             Discharge home in stable condition Infant Feeding:  Breast and bottle Infant Disposition: home with mother Discharge instruction: per After Visit Summary and Postpartum booklet. Activity: Advance as tolerated. Pelvic rest for 6 weeks.  Diet: routine diet Future Appointments: Future Appointments  Date Time Provider [place]  02/03/2022  1:55 PM [facility]  02/26/2022  1:45 PM [facility]  04/14/2022  3:30 PM Penumalli, Earlean Polka, MD [facility] None    Follow up Visit: Message sent by Dr. Vanetta Shawl 01/29/2022   Please schedule this patient for a In person postpartum visit in 4 weeks with the following provider: Any provider. Additional Postpartum F/U: Incision check 1 week and IUD string check at postpartum visit   High risk pregnancy complicated by:  Morbid obesity, NRFHT, breech presentation Delivery mode:  C-Section, Low Transverse  Anticipated Birth Control:  PP IUD placed  Karsten Fells, DO  PGY-1 01/29/2022, 6:32 AM  GME ATTESTATION:  I saw and evaluated the patient. I agree with the findings and the plan of care as documented in the residents note. I have made changes to documentation as necessary.  Progressing well postpartum and meeting all milestones. VSS. Incision healing well. Pain controlled and bleeding appropriate. Patient will have incision check in 1 week with postpartum visit in 4-6 weeks. Return precautions reviewed. All questions and concerns addressed. Patient voiced understanding. Stable for discharge.   Vilma Meckel, MD OB Fellow, Faculty Practice  [place] for [facility] 01/29/2022 6:39 AM

## 2022-01-27 NOTE — Progress Notes (Signed)
Patient ID: Ashley Dillon, female   DOB: 16-Jan-2001, 21 y.o.   MRN: [id number]  FACULTY PRACTICE ANTEPARTUM NOTE  Anisah Kuck is a 21 y.o. G1P0000 at [redacted]w[redacted]d  who is admitted for [facility] 4/8.   Fetal presentation is breech. Length of Stay:  1  Days  Subjective: No complaints Patient reports good fetal movement.   She reports no uterine contractions She reports no bleeding  She reports no loss of fluid per vagina.  Vitals:  Blood pressure (!) 107/52, pulse 97, temperature 98.3 F (36.8 C), temperature source Oral, resp. rate 19, height 5\' 3"  (1.6 m), weight (!) 151.5 kg, last menstrual period 04/11/2021, SpO2 99 %. Physical Examination:  General appearance - alert, well appearing, and in no distress Heart - normal rate, regular rhythm, normal S1, S2, no murmurs, rubs, clicks or gallops Abdomen - soft, nontender, nondistended, no masses or organomegaly Fundal Height:  size equals dates Membranes:intact  Fetal Monitoring:  Baseline: 140 bpm, Variability: Good {> 6 bpm), Accelerations: Reactive, and Decelerations: Absent  Labs:  Results for orders placed or performed during the hospital encounter of 01/26/22 (from the past 24 hour(s))  Resp Panel by RT-PCR (Flu A&B, Covid) Nasopharyngeal Swab   Collection Time: 01/26/22  7:48 PM   Specimen: Nasopharyngeal Swab; Nasopharyngeal(NP) swabs in vial transport medium  Result Value Ref Range   SARS Coronavirus 2 by RT PCR NEGATIVE NEGATIVE   Influenza A by PCR NEGATIVE NEGATIVE   Influenza B by PCR NEGATIVE NEGATIVE  Comprehensive metabolic panel   Collection Time: 01/26/22 11:54 PM  Result Value Ref Range   Sodium 135 135 - 145 mmol/L   Potassium 3.7 3.5 - 5.1 mmol/L   Chloride 107 98 - 111 mmol/L   CO2 19 (L) 22 - 32 mmol/L   Glucose, Bld 95 70 - 99 mg/dL   BUN 8 6 - 20 mg/dL   Creatinine, Ser 01/28/22 0.44 - 1.00 mg/dL   Calcium 8.6 (L) 8.9 - 10.3 mg/dL   Total Protein 6.7 6.5 - 8.1 g/dL   Albumin 2.6 (L) 3.5 - 5.0 g/dL   AST 14 (L)  15 - 41 U/L   ALT 12 0 - 44 U/L   Alkaline Phosphatase 128 (H) 38 - 126 U/L   Total Bilirubin 0.4 0.3 - 1.2 mg/dL   GFR, Estimated 0.08 >67 mL/min   Anion gap 9 5 - 15  CBC on admission   Collection Time: 01/26/22 11:54 PM  Result Value Ref Range   WBC 11.8 (H) 4.0 - 10.5 K/uL   RBC 3.79 (L) 3.87 - 5.11 MIL/uL   Hemoglobin 11.7 (L) 12.0 - 15.0 g/dL   HCT 01/28/22 (L) 95.0 - 93.2 %   MCV 94.7 80.0 - 100.0 fL   MCH 30.9 26.0 - 34.0 pg   MCHC 32.6 30.0 - 36.0 g/dL   RDW 67.1 24.5 - 80.9 %   Platelets 278 150 - 400 K/uL   nRBC 0.0 0.0 - 0.2 %  Type and screen [facility]   Collection Time: 01/26/22 11:54 PM  Result Value Ref Range   ABO/RH(D) B POS    Antibody Screen NEG    Sample Expiration      01/29/2022,2359 Performed at [facility], [address], [place], [place]     Imaging Studies:       Medications:  Scheduled  docusate sodium  100 mg Oral Daily   levonorgestrel  1 each Intrauterine Once   prenatal multivitamin  1 tablet Oral Q1200   sodium chloride flush  3 mL Intravenous Q12H   I have reviewed the patient's current medications.  ASSESSMENT: Principal Problem:   Breech presentation of fetus Active Problems:   Breech presentation, no version   PLAN: The risks of cesarean section discussed with the patient included but were not limited to: bleeding which may require transfusion or reoperation; infection which may require antibiotics; injury to bowel, bladder, ureters or other surrounding organs; injury to the fetus; need for additional procedures including hysterectomy in the event of a life-threatening hemorrhage; placental abnormalities wth subsequent pregnancies, incisional problems, thromboembolic phenomenon and other postoperative/anesthesia complications. The patient concurred with the proposed plan, giving informed written consent for the procedure.   Patient has been NPO since last night she will remain NPO for procedure.  Anesthesia and OR aware.  Preoperative prophylactic Ancef ordered on call to the OR.  To OR when ready.  Levie Heritage, DO 01/27/2022 9:18 AM

## 2022-01-27 NOTE — Lactation Note (Addendum)
This note was copied from a baby's chart. Lactation Consultation Note  Patient Name: Ashley Dillon [id number] Date: 01/27/2022 Reason for consult: Initial assessment;Mother's request;Difficult latch;Primapara;1st time breastfeeding;Early term 37-38.6wks;Nipple pain/trauma;Breastfeeding assistance Age:21 hours  LC assisted latching infant in football with a blanquet under the breasts. Infant latching shallow not able to get depth on the breast to note any signs of milk transfer.  LC fed 3 ml of colostrum on a spoon. Mom currently pumping with DEBP to offer more.  Mom has eczema and her skin is flaking off around areola. Mom to use coconut oil before pumping.  Mom states she is not currently taking any medication for her eczema.   Results above shared with RN, Anda Kraft.   Mom feeding plan to EBF and offer her EBM in a bottle if infant not able to latch.   Plan 1. To feed based on cues 8-12x 24hr period. Mom to offer breasts and look for signs of milk transfer.  2. Mom to supplement with EBM 5-7 ml per feeding with spoon if more she can pace bottle feeding with slow flow nipple.  3. DEBP q 3hrs for 15 min  4. I and O sheet reviewed.  All questions answered at the end of the visit.   Maternal Data Has patient been taught Hand Expression?: Yes Does the patient have breastfeeding experience prior to this delivery?: No  Feeding Mother's Current Feeding Choice: Breast Milk  LATCH Score Latch: Repeated attempts needed to sustain latch, nipple held in mouth throughout feeding, stimulation needed to elicit sucking reflex.  Audible Swallowing: None (Nipples large infant not able to get depth on breast, just did a few sucks then pops off)  Type of Nipple: Everted at rest and after stimulation  Comfort (Breast/Nipple): Soft / non-tender  Hold (Positioning): Assistance needed to correctly position infant at breast and maintain latch.  LATCH Score: 6   Lactation Tools  Discussed/Used Tools: Pump;Flanges;Coconut oil Flange Size: 27 Breast pump type: Double-Electric Breast Pump Pump Education: Setup, frequency, and cleaning;Milk Storage Reason for Pumping: increase stimulation Pumping frequency: every 3 hrs for 15 min  Interventions Interventions: Breast feeding basics reviewed;Assisted with latch;Skin to skin;Breast massage;Hand express;Breast compression;Adjust position;Support pillows;Position options;Expressed milk;Coconut oil;DEBP;Education;Pace feeding;LC Magazine features editor;Infant Driven Feeding Algorithm education  Discharge Pump: Manual WIC Program: No  Consult Status Consult Status: Follow-up Date: 01/28/22 Follow-up type: In-patient    Aliece Honold  Nicholson-Springer 01/27/2022, 2:34 PM

## 2022-01-27 NOTE — Transfer of Care (Signed)
Immediate Anesthesia Transfer of Care Note  Patient: Ashley Dillon  Procedure(s) Performed: CESAREAN SECTION  Patient Location: PACU  Anesthesia Type:Spinal  Level of Consciousness: awake, alert  and oriented  Airway & Oxygen Therapy: Patient Spontanous Breathing  Post-op Assessment: Report given to RN and Post -op Vital signs reviewed and stable  Post vital signs: Reviewed and stable  Last Vitals:  Vitals Value Taken Time  BP 129/43 01/27/22 1108  Temp    Pulse 82 01/27/22 1115  Resp 14 01/27/22 1115  SpO2 100 % 01/27/22 1115  Vitals shown include unvalidated device data.  Last Pain:  Vitals:   01/27/22 1108  TempSrc: (P) Oral  PainSc: (P) 0-No pain         Complications: No notable events documented.

## 2022-01-28 ENCOUNTER — Encounter (HOSPITAL_COMMUNITY): Payer: Self-pay | Admitting: Family Medicine

## 2022-01-28 LAB — CBC
HCT: 26.9 % — ABNORMAL LOW (ref 36.0–46.0)
Hemoglobin: 8.7 g/dL — ABNORMAL LOW (ref 12.0–15.0)
MCH: 31 pg (ref 26.0–34.0)
MCHC: 32.3 g/dL (ref 30.0–36.0)
MCV: 95.7 fL (ref 80.0–100.0)
Platelets: 217 10*3/uL (ref 150–400)
RBC: 2.81 MIL/uL — ABNORMAL LOW (ref 3.87–5.11)
RDW: 12.3 % (ref 11.5–15.5)
WBC: 15.8 10*3/uL — ABNORMAL HIGH (ref 4.0–10.5)
nRBC: 0 % (ref 0.0–0.2)

## 2022-01-28 LAB — BIRTH TISSUE RECOVERY COLLECTION (PLACENTA DONATION)

## 2022-01-28 MED ORDER — SODIUM CHLORIDE 0.9 % IV SOLN
500.0000 mg | Freq: Once | INTRAVENOUS | Status: AC
  Administered 2022-01-28: 500 mg via INTRAVENOUS
  Filled 2022-01-28: qty 25

## 2022-01-28 NOTE — Lactation Note (Signed)
This note was copied from a baby's chart. Lactation Consultation Note  Patient Name: Ashley Dillon IRWER'X Date: 01/28/2022 Reason for consult: Follow-up assessment;1st time breastfeeding;Primapara;Early term 37-38.6wks Age:21 hours   P1 mother whose infant is now 17 hours old.  This is an ETI at 38+3 weeks.  Mother's current feeding preference is breast/formula.  Baby "Ashley Dillon" formula fed approximately 45 minutes ago.  Mother reported that she was told not to latch yet due to baby's inability to open her mouth wide for a good deep latch.  She has been formula feeding since yesterday.  Discussed the importance of frequent breast stimulation to help ensure a good milk supply.  Suggested mother attempt to latch with the next feeding and call her RN/LC for latch assistance as needed.  She can continue to provide formula as desired after latching.  Volume guidelines given and encouraged mother increase supplementation volumes, especially if baby does not latch.  She should now be consumeing 15-30 mls.  Mother verbalized understanding.  Mother has not been pumping either.  Reviewed the pump parts, assembly and cleaning.  Observed her pumping using the #27 flange.  Taught mother how to assess for correct flange size.  I anticipate she may need the #30 in the near future.  After 15 minutes of pumping she was able to obtain a drop of colostrum.  Mother will use EBM and coconut oil for nipple comfort.  Scaly skin noted on areola bilaterally.  Mother does not have a DEBP for home use.  She has a [facility] appointment tomorrow at 1500 via Zoom.  She is hoping to obtain a DEBP from [facility].  Father present.   Maternal Data Has patient been taught Hand Expression?: Yes Does the patient have breastfeeding experience prior to this delivery?: No  Feeding Mother's Current Feeding Choice: Breast Milk and Formula  LATCH Score                    Lactation Tools Discussed/Used Tools:  Pump;Flanges;Coconut oil Flange Size: 27 Breast pump type: Double-Electric Breast Pump;Manual Pump Education: Setup, frequency, and cleaning (Reviewed) Reason for Pumping: Breast stimulation for supplementation Pumping frequency: Every three hours Pumped volume:  (One drop)  Interventions Interventions: Breast feeding basics reviewed;Education  Discharge Pump: DEBP;Manual;WIC [place] ([place] [facility]) WIC Program: Yes  Consult Status Consult Status: Follow-up Date: 01/29/22 Follow-up type: In-patient    Orren Pietsch R Kiari Hosmer 01/28/2022, 1:47 PM

## 2022-01-28 NOTE — Progress Notes (Signed)
Post-op Day 1: Cesarean Delivery Subjective:  Doing well. No acute events overnight. Pain is well controlled. Patient reports tolerating PO and + flatus.  Foley catheter just removed, has not voided yet. Minimal ambulation. She is breast feeding, reporting some difficulty with latching and milk production. She has no other concerns at this time.  Objective: Vital signs in last 24 hours: Temp:  [97.5 F (36.4 C)-98.4 F (36.9 C)] 98.1 F (36.7 C) (02/22 0500) Pulse Rate:  [70-97] 78 (02/22 0500) Resp:  [14-24] 16 (02/22 0500) BP: (91-129)/(43-85) 119/63 (02/22 0500) SpO2:  [96 %-100 %] 100 % (02/22 0500)  Physical Exam:  General: Alert, cooperative, no acute distress Heart: Normal rate, warm and well-perfused Lungs: Normal work of breathing on room air Uterine Fundus: Firm Incision: Healing well, no significant drainage, no dehiscence, no significant erythema Extremities: SCDs in place bilaterally, no LE edema, no calf tenderness to palpation  Recent Labs    01/26/22 2354 01/28/22 0506  HGB 11.7* 8.7*  HCT 35.9* 26.9*    Assessment/Plan: Ashley Dillon is a 21 y.o. G1P1001 POD#1 LTCS for breech presentation.  Doing well postoperatively. Meeting postpartum milestones. VSS. Continue routine care, encouraged ambulation.   #Acute Blood Loss Anemia:  - Total EBL ~557 mL - Hgb 11.7 > 8.7 - No lightheadedness or dizziness, has not ambulated much yet - Patient is agreeable to IV iron infusion  #Feeding: Breast/Bottle. Lactation consultation. #Contraception: Post-placental Liletta  Dispo: Plan for discharge on POD#2.    Myrna Blazer, DO PGY-1 01/28/2022, 6:22 AM

## 2022-01-29 ENCOUNTER — Encounter (HOSPITAL_COMMUNITY): Payer: Self-pay | Admitting: Family Medicine

## 2022-01-29 ENCOUNTER — Other Ambulatory Visit (HOSPITAL_COMMUNITY)
Admission: RE | Admit: 2022-01-29 | Discharge: 2022-01-29 | Disposition: A | Discharge status: Home / Self Care | Payer: Medicaid Other | Source: Ambulatory Visit | Attending: Family Medicine | Admitting: Family Medicine

## 2022-01-29 MED ORDER — OXYCODONE HCL 5 MG PO TABS: 5.0000 mg | ORAL_TABLET | Freq: Four times a day (QID) | ORAL | 0 refills | Status: DC | PRN

## 2022-01-29 MED ORDER — ACETAMINOPHEN 500 MG PO TABS: 1000.0000 mg | ORAL_TABLET | Freq: Three times a day (TID) | ORAL | 0 refills | Status: DC | PRN

## 2022-01-29 MED ORDER — IBUPROFEN 600 MG PO TABS: 600.0000 mg | ORAL_TABLET | Freq: Four times a day (QID) | ORAL | 0 refills | Status: DC | PRN

## 2022-01-29 NOTE — Lactation Note (Signed)
This note was copied from a baby's chart. Lactation Consultation Note  Patient Name: Ashley Dillon [id number] Date: 01/29/2022 Reason for consult: Follow-up assessment Age:21 hours   P1 mother whose infant is now 30 hours old.  This is an ETI at 38+3 weeks.  Mother's current feeding preference is breast/formula.  Comprehensive visit completed yesterday with mother regarding breast feeding and pumping.  Suggested mother attempt to latch yesterday, however, she has chosen not to do this.  Also started pumping with the DEBP; mother did not pump at all during the night but was pumping when I arrived this morning.  Emphasized the importance of pumping every three hours to help establish a good milk supply especially since she is not latching "Ashley Dillon" to the breast.  Mother verbalized understanding.  She had obtained a few drops of colostrum after this session and will finger feed the drops to "Ashley Dillon."  Mother has increased the formula supplementation volumes as discussed yesterday.  Baby tolerating well.  Mother has a 1500 appointment today with [facility] to obtain her DEBP.  Father present.  Family has our OP phone number for any further questions/concerns.  RN updated.   Maternal Data    Feeding    LATCH Score                    Lactation Tools Discussed/Used    Interventions    Discharge Discharge Education: Engorgement and breast care  Consult Status Consult Status: Complete Date: 01/29/22 Follow-up type: Call as needed    Aneisha Skyles R Deshawn Witty 01/29/2022, 7:59 AM

## 2022-01-31 ENCOUNTER — Encounter (HOSPITAL_COMMUNITY): Admission: RE | Payer: Self-pay | Source: Home / Self Care

## 2022-01-31 ENCOUNTER — Observation Stay (HOSPITAL_COMMUNITY)
Admission: AD | Admit: 2022-01-31 | Payer: Medicaid Other | Source: Home / Self Care | Admitting: Obstetrics & Gynecology

## 2022-01-31 ENCOUNTER — Inpatient Hospital Stay (HOSPITAL_COMMUNITY): Admission: RE | Admit: 2022-01-31 | Payer: Medicaid Other | Source: Home / Self Care | Admitting: Family Medicine

## 2022-01-31 ENCOUNTER — Observation Stay (HOSPITAL_COMMUNITY): Admission: RE | Admit: 2022-01-31 | Payer: Medicaid Other | Source: Ambulatory Visit

## 2022-01-31 DIAGNOSIS — O99210 Obesity complicating pregnancy, unspecified trimester: Secondary | ICD-10-CM

## 2022-01-31 DIAGNOSIS — O0990 Supervision of high risk pregnancy, unspecified, unspecified trimester: Secondary | ICD-10-CM

## 2022-01-31 SURGERY — Surgical Case
Anesthesia: Choice

## 2022-02-02 ENCOUNTER — Ambulatory Visit: Payer: Medicaid Other

## 2022-02-03 ENCOUNTER — Other Ambulatory Visit: Payer: Self-pay

## 2022-02-03 ENCOUNTER — Ambulatory Visit (INDEPENDENT_AMBULATORY_CARE_PROVIDER_SITE_OTHER): Payer: Medicaid Other | Admitting: Family Medicine

## 2022-02-03 VITALS — BP 138/86 | HR 82 | Wt 339.0 lb

## 2022-02-03 DIAGNOSIS — Z98891 History of uterine scar from previous surgery: Secondary | ICD-10-CM

## 2022-02-03 NOTE — Progress Notes (Signed)
GYNECOLOGY OFFICE VISIT NOTE  History:   Ashley Dillon is a 21 y.o. G1P1001 here today for incision check.  Patient is doing well from cesarean one week prior Reports she doesn't really have any pain, not using meds No problems with incision Vaginal bleeding still like a period  Health Maintenance Due  Topic Date Due   COVID-19 Vaccine (1) Never done   HPV VACCINES (1 - 2-dose series) Never done   PAP-Cervical Cytology Screening  12/30/2021   PAP SMEAR-Modifier  12/30/2021    Past Medical History:  Diagnosis Date   Chronic headaches     Past Surgical History:  Procedure Laterality Date   CESAREAN SECTION N/A 01/27/2022   Procedure: CESAREAN SECTION;  Surgeon: Levie Heritage, DO;  Location: [facility]S;  Service: Obstetrics;  Laterality: N/A;   WISDOM TOOTH EXTRACTION      The following portions of the patient's history were reviewed and updated as appropriate: allergies, current medications, past family history, past medical history, past social history, past surgical history and problem list.   Health Maintenance:   Last pap: No results found for: DIAGPAP, HPV, HPVHIGH Needs at routine postpartum visit  Last mammogram:  N/a   Review of Systems:  Pertinent items noted in HPI and remainder of comprehensive ROS otherwise negative.  Physical Exam:  BP 138/86    Pulse 82    Wt (!) 339 lb (153.8 kg)    LMP  (LMP Unknown)    Breastfeeding Yes    BMI 60.05 kg/m  CONSTITUTIONAL: Well-developed, well-nourished female in no acute distress.  HEENT:  Normocephalic, atraumatic. External right and left ear normal. No scleral icterus.  NECK: Normal range of motion, supple, no masses noted on observation SKIN: cesarean incision c/d/I, no drainage, no erythema, no induration, very small ~1 cm area of superficial separation centrally MUSCULOSKELETAL: Normal range of motion. No edema noted. NEUROLOGIC: Alert and oriented to person, place, and time. Normal muscle tone  coordination.  PSYCHIATRIC: Normal mood and affect. Normal behavior. Normal judgment and thought content. RESPIRATORY: Effort normal, no problems with respiration noted   Labs and Imaging Results for orders placed or performed during the hospital encounter of 01/26/22 (from the past 168 hour(s))  CBC   Collection Time: 01/28/22  5:06 AM  Result Value Ref Range   WBC 15.8 (H) 4.0 - 10.5 K/uL   RBC 2.81 (L) 3.87 - 5.11 MIL/uL   Hemoglobin 8.7 (L) 12.0 - 15.0 g/dL   HCT 16.1 (L) 09.6 - 04.5 %   MCV 95.7 80.0 - 100.0 fL   MCH 31.0 26.0 - 34.0 pg   MCHC 32.3 30.0 - 36.0 g/dL   RDW 40.9 81.1 - 91.4 %   Platelets 217 150 - 400 K/uL   nRBC 0.0 0.0 - 0.2 %  Collect bld for placenta donatation   Collection Time: 01/28/22  5:06 AM  Result Value Ref Range   Placenta donation bld collect Collected by Laboratory    MR BRAIN WO CONTRAST  Result Date: 01/06/2022 [facility] NEUROIMAGING REPORT STUDY DATE: 01/05/22 PATIENT NAME: Ashley Dillon DOB: 2000/12/10 MRN: [id number] ORDERING CLINICIAN: Suanne Marker, MD CLINICAL HISTORY: 21 year old female with headaches and floaters. EXAM: MR BRAIN WO CONTRAST TECHNIQUE: MRI of the brain without contrast was obtained utilizing 5 mm axial slices with T1, T2, T2 flair, SWI and diffusion weighted views.  T1 sagittal and T2 coronal views were obtained. CONTRAST: none COMPARISON: none IMAGING SITE:  Imaging [address] (1.5 Tesla MRI)  FINDINGS: No abnormal lesions are seen on diffusion-weighted views to suggest acute ischemia. The cortical sulci, fissures and cisterns are normal in size and appearance. Lateral, third and fourth ventricle are normal in size and appearance. No extra-axial fluid collections are seen. No evidence of mass effect or midline shift.  On sagittal views the posterior fossa, pituitary gland and corpus callosum are unremarkable. No evidence of intracranial hemorrhage on SWI views. The orbits and their  contents, paranasal sinuses and calvarium are unremarkable. Mild mucus thickening in the maxillary and ethmoid sinuses. Intracranial flow voids are present.   Normal MRI brain (without). INTERPRETING PHYSICIAN: Suanne Marker, MD Certified in Neurology, Neurophysiology and Neuroimaging [facility] [address], [address] [place], [place] [address] [phone]   [place] MFM FETAL BPP WO NON STRESS  Result Date: 01/26/2022 ----------------------------------------------------------------------  OBSTETRICS REPORT                       (Signed Final 01/26/2022 02:51 pm) ---------------------------------------------------------------------- Patient Info  ID #:       [id number]                          D.O.B.:  [birth year] (21 yrs)  Name:       Ashley Dillon                 Visit Date: 01/26/2022 01:30 pm ---------------------------------------------------------------------- Performed By  Attending:        Noralee Space MD        Referred By:      Brand Males  Performed By:     Eden Lathe BS      [facility]                    RDMS RVT                                 [facility] ---------------------------------------------------------------------- Orders  #  Description                           Code        Ordered By  1  [place] MFM FETAL  BPP WO NON               76819.01    RAVI [facility]     STRESS ----------------------------------------------------------------------  #  Order #                     Accession #                Episode #  1  [id number]                   [id number]                 [id number] ---------------------------------------------------------------------- Indications  [redacted] weeks gestation of pregnancy                Z3A.38  Marginal insertion of umbilical  cord affecting O43.193  management of mother in third trimester  Obesity complicating pregnancy, third          O99.213  trimester (BMI 50)  Encounter for other antenatal screening        Z36.2  follow-up ---------------------------------------------------------------------- Fetal Evaluation  Num Of Fetuses:         1  Fetal Heart Rate(bpm):  143  Cardiac Activity:       Observed  Presentation:           Breech  Amniotic Fluid  AFI FV:      Within normal limits  AFI Sum(cm)     %Tile       Largest Pocket(cm)  13.9            54          7.6  RUQ(cm)       RLQ(cm)       LUQ(cm)        LLQ(cm)  7.6           1.1           2.6            2.6 ---------------------------------------------------------------------- Biophysical Evaluation  Amniotic F.V:   Within normal limits       F. Tone:        Not Observed  F. Movement:    Not Observed               Score:          4/8  F. Breathing:   Observed ---------------------------------------------------------------------- Biometry  LV:        3.8  mm ---------------------------------------------------------------------- Gestational Age  LMP:           41w 3d        Date:  04/11/21                 EDD:   01/16/22  Best:          38w 2d     Det. By:  U/S  (09/03/21)          EDD:   02/07/22 ---------------------------------------------------------------------- Anatomy  Diaphragm:             Appears normal         Kidneys:                Appear normal  Stomach:               Appears normal, left   Bladder:                Appears normal  sided ---------------------------------------------------------------------- Impression  Class III obesity.  Patient returned for antenatal testing.  Marginal cord insertion.  Amniotic fluid is normal.  Fetal movements and tone did not  meet the criteria of BPP.  Fetal breathing movements are  seen.  BPP 4/8.  Breech presentation.  I discussed the significance of poor biophysical profile and  recommended delivery.  I  discussed with Dr.Eure. ---------------------------------------------------------------------- Recommendations  - [facility]  -Cesarean delivery tomorrow.  -If patient was [facility], it should be performed only if NST is  reactive.  -Repeat biophysical profile is not necessary.  -If NST is nonreactive, proceed to delivery today. ----------------------------------------------------------------------                  Noralee Spaceavi Shankar, MD Electronically Signed Final Report   01/26/2022 02:51 pm ----------------------------------------------------------------------  US MFM FETAL BPP WO NON STRESS  Result Date: 01/19/2022 ----------------------------------------------------------------------  OBSTETRICS REPORT                        (Signed Final 01/19/2022 07:51 pm) ---------------------------------------------------------------------- Patient Info  ID #:       [id number]                          D.O.B.:  Jun 27, 2001 (21 yrs)  Name:       Ashley Dillon                 Visit Date: 01/19/2022 03:11 pm ---------------------------------------------------------------------- Performed By  Attending:        Lin Landsmanorenthian Booker      Referred By:       Carla DrapeANIELLE L                    MD                                        SIMPSON  Performed By:     Tommie RaymondMesha Tester BS,       Location[facility]                    RDMS, RVT                                 [facility] ---------------------------------------------------------------------- Orders  #  Description                           Code  Ordered By  1  [place] MFM FETAL BPP WO NON               [id number]    RAVI [facility]     STRESS ----------------------------------------------------------------------  #  Order #                     Accession #                Episode #  1  [id number]                    [id number]                 [id number] ---------------------------------------------------------------------- Indications  [redacted] weeks gestation of pregnancy                 Z3A.37  Marginal insertion of umbilical cord affecting  O43.193  management of mother in third trimester  Obesity complicating pregnancy, third           O99.213  trimester (BMI 50) ---------------------------------------------------------------------- Fetal Evaluation  Num Of Fetuses:          1  Cardiac Activity:        Observed  Presentation:            Breech  Placenta:                Posterior  P. Cord Insertion:       Marginal insertion prev.  vis.  Amniotic Fluid  AFI FV:      Within normal limits ---------------------------------------------------------------------- Biophysical Evaluation  Amniotic F.V:   Pocket => 2 cm             F. Tone:         Observed  F. Movement:    Observed                   Score:           8/8  F. Breathing:   Observed ---------------------------------------------------------------------- Gestational Age  LMP:           40w 3d        Date:  04/11/21                 EDD:   01/16/22  Best:          37w 2d     Det. By:  U/S  (09/03/21)          EDD:   02/07/22 ---------------------------------------------------------------------- Anatomy  Ventricles:            Appears normal         Kidneys:                Appear normal  Diaphragm:             Appears normal         Bladder:                Appears normal  Stomach:               Appears normal, left                         sided  Other:  Technicallly difficult due to advanced GA and maternal habitus. ---------------------------------------------------------------------- Cervix Uterus Adnexa  Cervix  Not visualized (advanced GA >24wks)  Uterus  No abnormality visualized.  Right Ovary  Within normal limits.  Left Ovary  Within normal limits.  Cul De Sac  No free fluid seen.  Adnexa  No abnormality visualized.  ---------------------------------------------------------------------- Impression  Antenatal testing performed given maternal marginal cord  insertion and elevated BMI.  The biophysical profile was 8/8 with good fetal movement and  amniotic fluid volume. ---------------------------------------------------------------------- Recommendations  Continue weekly testing. ----------------------------------------------------------------------               Lin Landsman, MD Electronically Signed Final Report   01/19/2022 07:51 pm ----------------------------------------------------------------------  [place] MFM FETAL BPP WO NON STRESS  Result Date: 01/05/2022 ----------------------------------------------------------------------  OBSTETRICS REPORT                       (Signed Final 01/05/2022 04:21 pm) ---------------------------------------------------------------------- Patient Info  ID #:       [id number]                          D.O.B.:  2001-09-11 (21 yrs)  Name:       Ashley Dillon                 Visit Date: 01/05/2022 11:15 am ---------------------------------------------------------------------- Performed By  Attending:        Noralee Space MD        Referred By:      Brand Males  Performed By:     Reinaldo Raddle            [facility]                    RDMS                                     [facility] ---------------------------------------------------------------------- Orders  #  Description                           Code        Ordered By  1  [place] MFM OB FOLLOW UP                   41287.86    CORENTHIAN  BOOKER  2  [place] MFM FETAL BPP WO NON               [id number]    CORENTHIAN     STRESS                                             BOOKER ----------------------------------------------------------------------  #  Order #                     Accession #                Episode #  1  [id number]                   [id number]                 [id number]  2  [id number]                   [id number]                 [id number] ---------------------------------------------------------------------- Indications  Marginal insertion of umbilical cord affecting O43.193  management of mother in third trimester  Obesity complicating pregnancy, third          O99.213  trimester (BMI 50)  [redacted] weeks gestation of pregnancy                Z3A.35 ---------------------------------------------------------------------- Fetal Evaluation  Num Of Fetuses:         1  Fetal Heart Rate(bpm):  131  Cardiac Activity:       Observed  Presentation:           Breech  Placenta:               Posterior  P. Cord Insertion:      Marginal insertion  Amniotic Fluid  AFI FV:      Within normal limits  AFI Sum(cm)     %Tile       Largest Pocket(cm)  16.37           60          5.76  RUQ(cm)       RLQ(cm)       LUQ(cm)        LLQ(cm)  5.76          2.46          4.6            3.55 ---------------------------------------------------------------------- Biophysical Evaluation  Amniotic F.V:   Within normal limits       F. Tone:        Observed  F. Movement:    Observed                   Score:          8/8  F. Breathing:   Observed ---------------------------------------------------------------------- Biometry  BPD:      90.2  mm     G. Age:  36w 4d         85  %    CI:        72.55   %    70 - 86  FL/HC:      19.1   %    20.1 - 22.3  HC:      336.8  mm     G. Age:  38w 4d         90  %    HC/AC:      1.09        0.93 - 1.11  AC:      309.4  mm     G. Age:  34w 6d         46  %    FL/BPD:     71.3   %    71 - 87  FL:       64.3  mm     G. Age:  33w 1d          5  %    FL/AC:      20.8   %    20 - 24  LV:        1.2  mm  Est. FW:    2559  gm     5 lb 10 oz      38  % ---------------------------------------------------------------------- Gestational Age  LMP:           38w 3d        Date:  04/11/21                 EDD:   01/16/22  U/S Today:     35w 6d                                        EDD:   02/03/22  Best:          35w 2d     Det. By:  U/S  (09/03/21)          EDD:   02/07/22 ---------------------------------------------------------------------- Anatomy  Cranium:               Appears normal         LVOT:                   Previously seen  Cavum:                 Previously seen        Aortic Arch:            Previously seen  Ventricles:            Appears normal         Ductal Arch:            Previously seen  Choroid Plexus:        Previously seen        Diaphragm:              Appears normal  Cerebellum:            Previously seen        Stomach:                Appears normal, left  sided  Posterior Fossa:       Previously seen        Abdomen:                Previously seen  Nuchal Fold:           Not applicable (>20    Abdominal Wall:         Previously seen                         wks GA)  Face:                  Orbits and profile     Cord Vessels:           Previously seen                         previously seen  Lips:                  Previously seen        Kidneys:                Appear normal  Palate:                Previously seen        Bladder:                Appears normal  Thoracic:              Appears normal         Spine:                  Previously seen  Heart:                 Previously seen        Upper Extremities:      Previously seen  RVOT:                  Previously seen        Lower Extremities:      Previously seen  Other:  Female gender previously seen. Technically difficult due to maternal          habitus. Hands previously visualized. Heels/feet prev visualized. .          Nasal bone, lenses, VC, 3VV and 3VTV prev visualized.  ---------------------------------------------------------------------- Cervix Uterus Adnexa  Cervix  Not visualized (advanced GA >24wks)  Uterus  No abnormality visualized.  Right Ovary  Within normal limits.  Left Ovary  Within normal limits.  Cul De Sac  No free fluid seen.  Adnexa  No adnexal mass visualized. ---------------------------------------------------------------------- Impression  Prepregnancy BMI 50.  Marginal cord insertion.  Fetal growth is appropriate for gestational age .Amniotic fluid  is normal and good fetal activity is seen .Antenatal testing is  reassuring. BPP 8/8. ----------------------------------------------------------------------                  Noralee Space, MD Electronically Signed Final Report   01/05/2022 04:21 pm ----------------------------------------------------------------------  [place] MFM OB FOLLOW UP  Result Date: 01/05/2022 ----------------------------------------------------------------------  OBSTETRICS REPORT                       (Signed Final 01/05/2022 04:21 pm) ---------------------------------------------------------------------- Patient Info  ID #:       [id number]  D.O.B.:  04/16/2001 (21 yrs)  Name:       Ashley Dillon                 Visit Date: 01/05/2022 11:15 am ---------------------------------------------------------------------- Performed By  Attending:        Noralee Space MD        Referred By:      Brand Males  Performed By:     Reinaldo Raddle            [facility]                    RDMS                                     [facility] ---------------------------------------------------------------------- Orders  #  Description                           Code        Ordered By  1  [place] MFM OB FOLLOW UP                    76816.01    Lin Landsman  2  [place] MFM FETAL BPP WO NON               [address].60    CORENTHIAN     STRESS                                            BOOKER ----------------------------------------------------------------------  #  Order #                     Accession #                Episode #  1  [id number]  [id number]                 [id number]  2  [id number]                   [id number]                 [id number] ---------------------------------------------------------------------- Indications  Marginal insertion of umbilical cord affecting O43.193  management of mother in third trimester  Obesity complicating pregnancy, third          O99.213  trimester (BMI 50)  [redacted] weeks gestation of pregnancy                Z3A.35 ---------------------------------------------------------------------- Fetal Evaluation  Num Of Fetuses:         1  Fetal Heart Rate(bpm):  131  Cardiac Activity:       Observed  Presentation:           Breech  Placenta:               Posterior  P. Cord Insertion:      Marginal insertion  Amniotic Fluid  AFI FV:      Within normal limits  AFI Sum(cm)     %Tile       Largest Pocket(cm)  16.37           60          5.76  RUQ(cm)       RLQ(cm)       LUQ(cm)        LLQ(cm)  5.76          2.46          4.6            3.55 ---------------------------------------------------------------------- Biophysical Evaluation  Amniotic F.V:   Within normal limits       F. Tone:        Observed  F. Movement:    Observed                   Score:          8/8  F. Breathing:   Observed ---------------------------------------------------------------------- Biometry  BPD:      90.2  mm     G. Age:  36w 4d         85  %    CI:        72.55   %    70 - 86                                                          FL/HC:      19.1   %    20.1 - 22.3  HC:      336.8  mm     G. Age:  38w 4d         90  %    HC/AC:      1.09        0.93 - 1.11  AC:       309.4  mm     G. Age:  34w 6d         46  %    FL/BPD:     71.3   %    71 - 87  FL:  64.3  mm     G. Age:  33w 1d          5  %    FL/AC:      20.8   %    20 - 24  LV:        1.2  mm  Est. FW:    2559  gm    5 lb 10 oz      38  % ---------------------------------------------------------------------- Gestational Age  LMP:           38w 3d        Date:  04/11/21                 EDD:   01/16/22  U/S Today:     35w 6d                                        EDD:   02/03/22  Best:          35w 2d     Det. By:  U/S  (09/03/21)          EDD:   02/07/22 ---------------------------------------------------------------------- Anatomy  Cranium:               Appears normal         LVOT:                   Previously seen  Cavum:                 Previously seen        Aortic Arch:            Previously seen  Ventricles:            Appears normal         Ductal Arch:            Previously seen  Choroid Plexus:        Previously seen        Diaphragm:              Appears normal  Cerebellum:            Previously seen        Stomach:                Appears normal, left                                                                        sided  Posterior Fossa:       Previously seen        Abdomen:                Previously seen  Nuchal Fold:           Not applicable (>20    Abdominal Wall:         Previously seen                         wks GA)  Face:  Orbits and profile     Cord Vessels:           Previously seen                         previously seen  Lips:                  Previously seen        Kidneys:                Appear normal  Palate:                Previously seen        Bladder:                Appears normal  Thoracic:              Appears normal         Spine:                  Previously seen  Heart:                 Previously seen        Upper Extremities:      Previously seen  RVOT:                  Previously seen        Lower Extremities:      Previously seen  Other:  Female gender  previously seen. Technically difficult due to maternal          habitus. Hands previously visualized. Heels/feet prev visualized. .          Nasal bone, lenses, VC, 3VV and 3VTV prev visualized. ---------------------------------------------------------------------- Cervix Uterus Adnexa  Cervix  Not visualized (advanced GA >24wks)  Uterus  No abnormality visualized.  Right Ovary  Within normal limits.  Left Ovary  Within normal limits.  Cul De Sac  No free fluid seen.  Adnexa  No adnexal mass visualized. ---------------------------------------------------------------------- Impression  Prepregnancy BMI 50.  Marginal cord insertion.  Fetal growth is appropriate for gestational age .Amniotic fluid  is normal and good fetal activity is seen .Antenatal testing is  reassuring. BPP 8/8. ----------------------------------------------------------------------                  Noralee Space, MD Electronically Signed Final Report   01/05/2022 04:21 pm ----------------------------------------------------------------------     Assessment and Plan:   Problem List Items Addressed This Visit       Other   S/P primary low transverse C-section - Primary   Cesarean healing well Pain well controlled  Return in about 4 weeks (around 03/03/2022) for PP check.    Total face-to-face time with patient: 10 minutes.  Over 50% of encounter was spent on counseling and coordination of care.   Venora Maples, MD/MPH Attending Family Medicine Physician, [facility] for [facility], [facility]

## 2022-02-04 ENCOUNTER — Ambulatory Visit: Payer: Medicaid Other

## 2022-02-05 ENCOUNTER — Encounter (HOSPITAL_COMMUNITY): Payer: Self-pay | Admitting: Family Medicine

## 2022-02-06 ENCOUNTER — Ambulatory Visit: Payer: Medicaid Other

## 2022-02-26 ENCOUNTER — Other Ambulatory Visit: Payer: Self-pay

## 2022-02-26 ENCOUNTER — Ambulatory Visit (INDEPENDENT_AMBULATORY_CARE_PROVIDER_SITE_OTHER): Payer: Medicaid Other | Admitting: Family Medicine

## 2022-02-26 NOTE — Progress Notes (Signed)
? ? ?  Post Partum Visit Note ? ?Ashley Dillon is a 21 y.o. G11P1001 female who presents for a postpartum visit. She is 4 weeks postpartum following a primary cesarean section.  I have fully reviewed the prenatal and intrapartum course. The delivery was at [redacted]w[redacted]d gestational weeks.  Anesthesia: spinal. Postpartum course has been . Baby is doing well. Baby is feeding by bottle - breast milk . Bleeding no bleeding. Bowel function is normal. Bladder function is normal. Patient is sexually active. Contraception method IUD.Marland Kitchen Postpartum depression screening: negative. ? ? ?The pregnancy intention screening data noted above was reviewed. Potential methods of contraception were discussed. The patient elected to proceed with No data recorded. ? ? ? ?Health Maintenance Due  ?Topic Date Due  ? COVID-19 Vaccine (1) Never done  ? HPV VACCINES (1 - 2-dose series) Never done  ? PAP-Cervical Cytology Screening  12/30/2021  ? PAP SMEAR-Modifier  12/30/2021  ? ? ?The following portions of the patient's history were reviewed and updated as appropriate: allergies, current medications, past family history, past medical history, past social history, past surgical history, and problem list. ? ?Review of Systems ?Pertinent items are noted in HPI. ? ?Objective:  ?LMP  (LMP Unknown)   ? ?General:  alert, cooperative, and appears stated age  ? Breasts:  not indicated  ?Lungs: clear to auscultation bilaterally  ?Heart:  regular rate and rhythm, S1, S2 normal, no murmur, click, rub or gallop  ?Abdomen: soft, non-tender; bowel sounds normal; no masses,  no organomegaly   ?Wound well approximated incision  ?GU exam:  not indicated  ?     ?Assessment:  ? ? There are no diagnoses linked to this encounter. ? ?Normal postpartum exam.  ? ?Plan:  ? ?Essential components of care per ACOG recommendations: ? ?1.  Mood and well being: Patient with negative depression screening today. Reviewed local resources for support.  ?- Patient tobacco use? No.   ?- hx  of drug use? No.   ? ?2. Infant care and feeding:  ?-Patient currently breastmilk feeding? Yes. Reviewed importance of draining breast regularly to support lactation.  ?-Social determinants of health (SDOH) reviewed in EPIC. No concerns ? ?3. Sexuality, contraception and birth spacing ?- Patient does not want a pregnancy in the next year.  Desired family size is 1 children.  ?- Reviewed reproductive life planning. Reviewed contraceptive methods based on pt preferences and effectiveness.  Patient desired IUD or IUS today.   ?- Discussed birth spacing of 18 months ? ?4. Sleep and fatigue ?-Encouraged family/partner/community support of 4 hrs of uninterrupted sleep to help with mood and fatigue ? ?5. Physical Recovery  ?- Discussed patients delivery and complications. She describes her labor as good. ?- Patient had a C-section.  Perineal healing reviewed. Patient expressed understanding ?- Patient has urinary incontinence? No. ?- Patient is safe to resume physical and sexual activity ? ?6.  Health Maintenance ?- HM due items addressed Yes ?- Last pap smear No results found for: DIAGPAP Pap smear not done at today's visit.  ?-- Declines pap today and will do at next visit,  ?-Breast Cancer screening indicated? No.  ? ?7. Chronic Disease/Pregnancy Condition follow up: None ? ?- PCP follow up ? ?Aviva Signs, CMA ?Center for [employer], [facility]  ?

## 2022-02-26 NOTE — Progress Notes (Signed)
Patient in for PP visit, no issues or concerns at this time. Patient is due for PAP but was not prepared for PAP to be done today. Will schedule GYN visit to come back for that. ? ?IUD placed post delivery, no issues or concerns with IUD. Reports no bleeding, sexually active, pumping breast milk.  ? ?Altha Harm, CMA ? ?

## 2022-03-02 ENCOUNTER — Encounter: Payer: Self-pay | Admitting: Family Medicine

## 2022-03-03 ENCOUNTER — Ambulatory Visit: Payer: Medicaid Other

## 2022-04-14 ENCOUNTER — Encounter: Payer: Self-pay | Admitting: Diagnostic Neuroimaging

## 2022-04-14 ENCOUNTER — Ambulatory Visit: Payer: Medicaid Other | Admitting: Diagnostic Neuroimaging

## 2022-11-12 ENCOUNTER — Ambulatory Visit (INDEPENDENT_AMBULATORY_CARE_PROVIDER_SITE_OTHER): Payer: Medicaid Other | Admitting: Family Medicine

## 2022-11-12 ENCOUNTER — Encounter: Payer: Self-pay | Admitting: Family Medicine

## 2022-11-12 DIAGNOSIS — N912 Amenorrhea, unspecified: Secondary | ICD-10-CM

## 2022-11-12 LAB — POCT PREGNANCY, URINE: Preg Test, Ur: NEGATIVE

## 2022-11-12 NOTE — Progress Notes (Signed)
   Subjective:    Patient ID: Ashley Dillon is a 21 y.o. female presenting with Possible Pregnancy  on 11/12/2022  HPI: Patient reports puling out her IUD accidentally. She has had unprotected intercourse. She is 5 days late for her cycle. Desires UPT today.  Review of Systems  Constitutional:  Negative for chills and fever.  Respiratory:  Negative for shortness of breath.   Cardiovascular:  Negative for chest pain.  Gastrointestinal:  Negative for abdominal pain, nausea and vomiting.  Genitourinary:  Negative for dysuria.  Skin:  Negative for rash.      Objective:    There were no vitals taken for this visit. Physical Exam Exam conducted with a chaperone present.  Constitutional:      General: She is not in acute distress.    Appearance: She is well-developed.  HENT:     Head: Normocephalic and atraumatic.  Eyes:     General: No scleral icterus. Cardiovascular:     Rate and Rhythm: Normal rate.  Pulmonary:     Effort: Pulmonary effort is normal.  Abdominal:     Palpations: Abdomen is soft.  Musculoskeletal:     Cervical back: Neck supple.  Skin:    General: Skin is warm and dry.  Neurological:     Mental Status: She is alert and oriented to person, place, and time.    UPT negative     Assessment & Plan:  Amenorrhea - UPT negative, desires IUD to be replaced-->blood work today - Plan: Beta hCG quant (ref lab)   Return in about 2 weeks (around 11/26/2022) for IUD insertion - with no unprotected intercourse x 2 weeks and negative UPT.  Reva Bores, MD 11/12/2022 11:51 AM

## 2022-11-13 LAB — BETA HCG QUANT (REF LAB): hCG Quant: <1 m[IU]/mL

## 2023-02-20 IMAGING — US US MFM OB COMP +14 WKS
1 series · 13 of 28 positions shown · non-contrast
Comparison: none

[Series 1: us mfm ob comp +14 wks · 74 acquisitions, 13 frames shown]
[im 3/74]
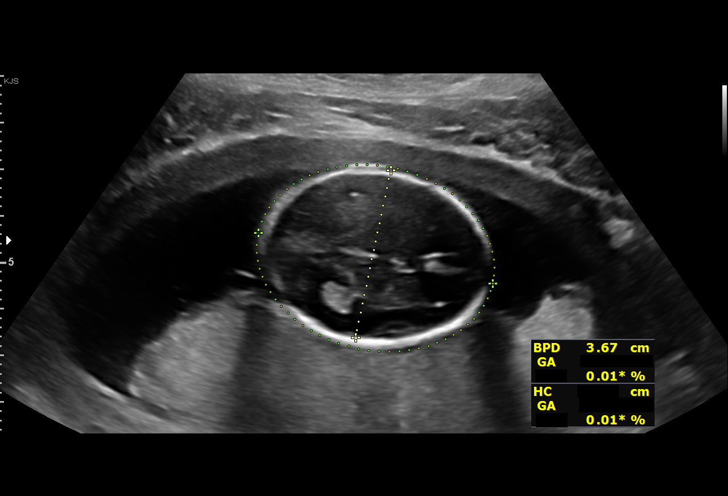
[im 9/74]
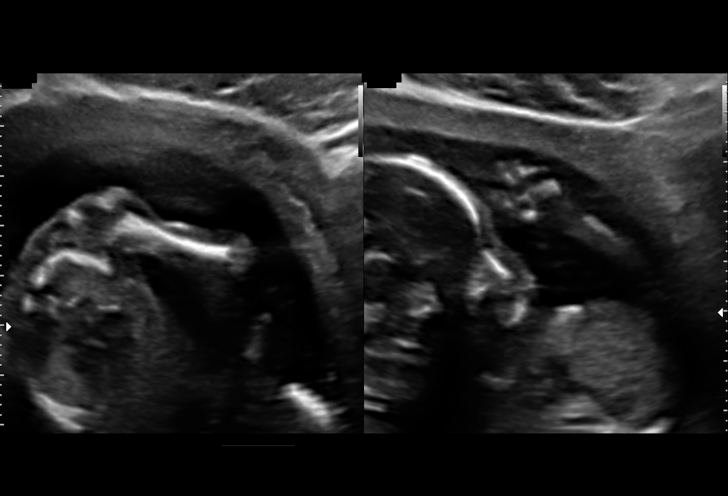
[im 14/74]
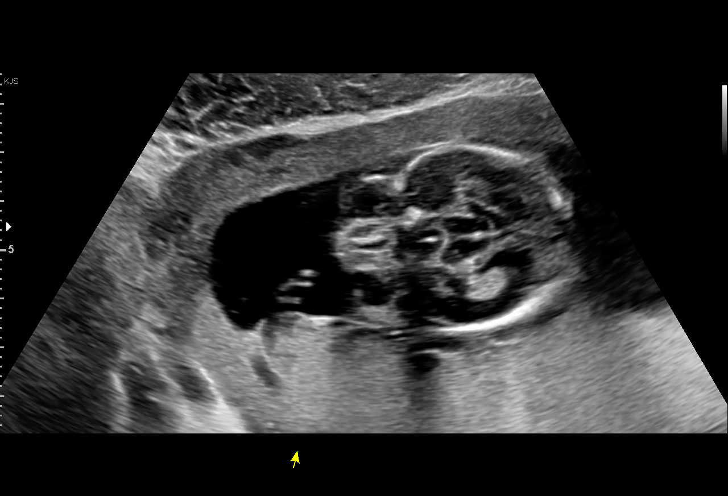
[im 19/74]
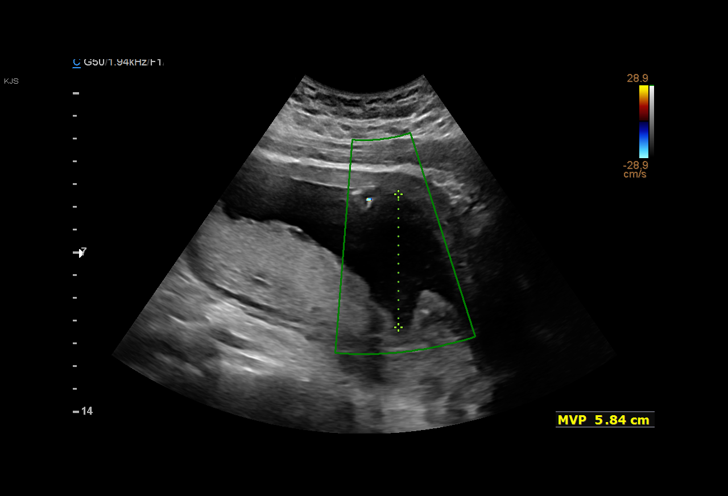
[im 25/74]
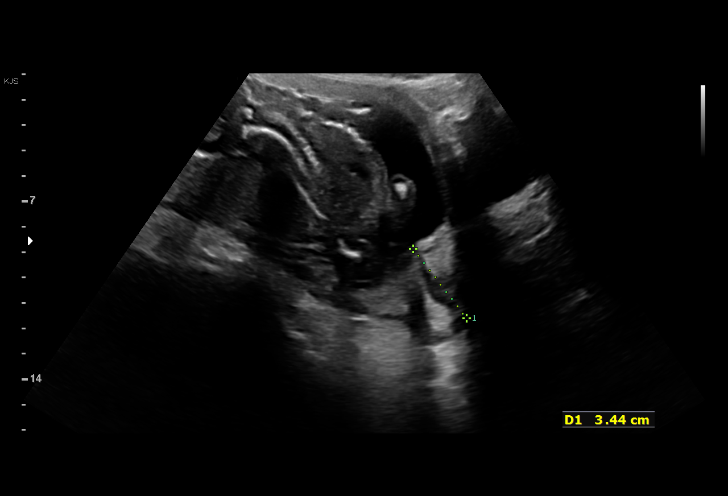
[im 30/74]
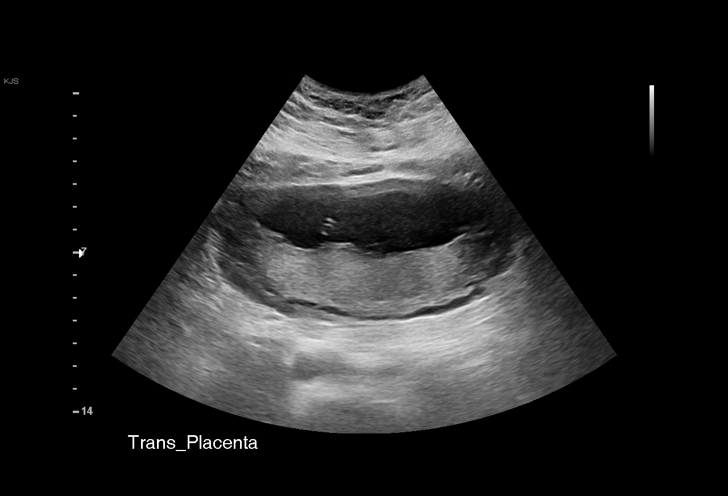
[im 38/74]
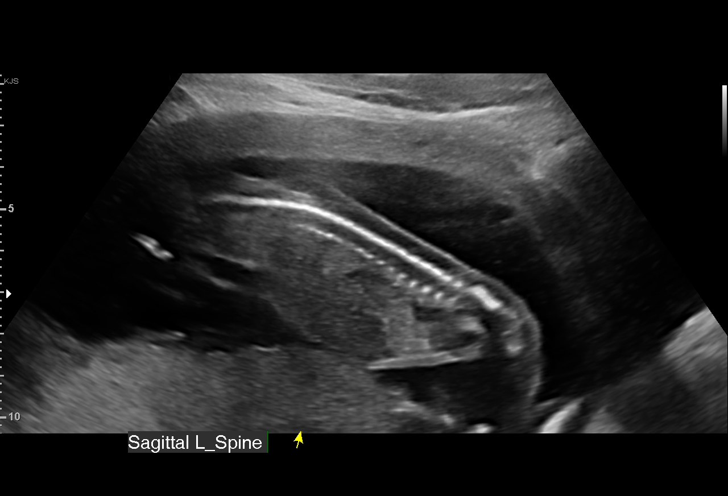
[im 44/74]
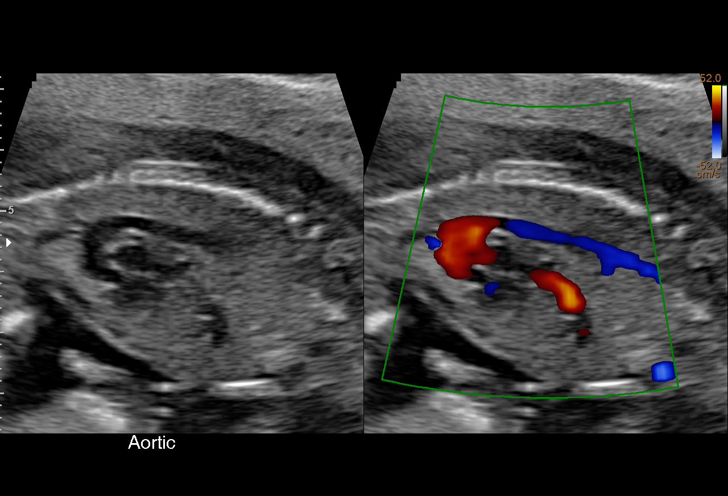
[im 49/74]
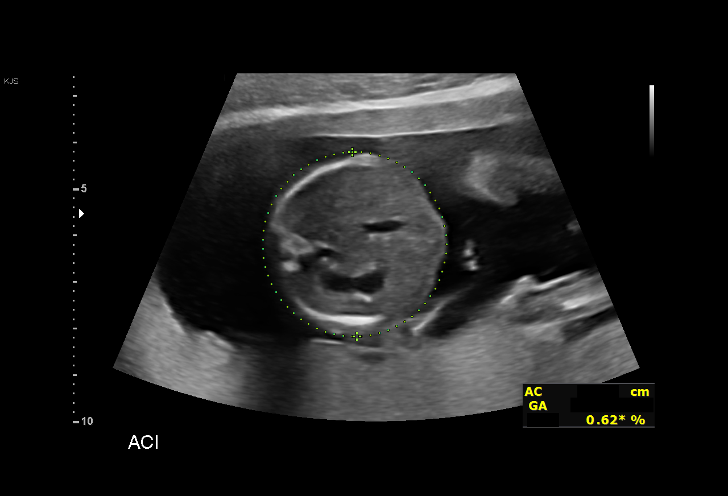
[im 55/74]
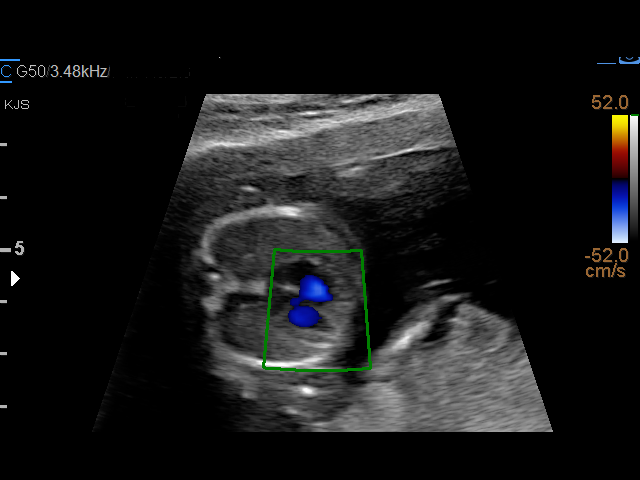
[im 60/74]
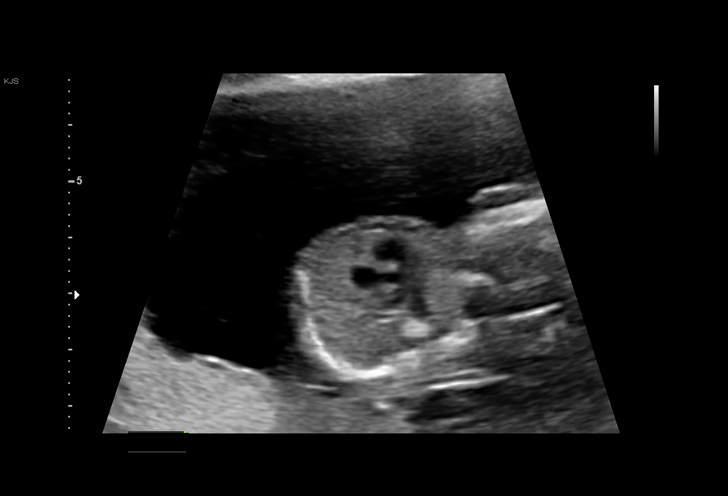
[im 65/74]
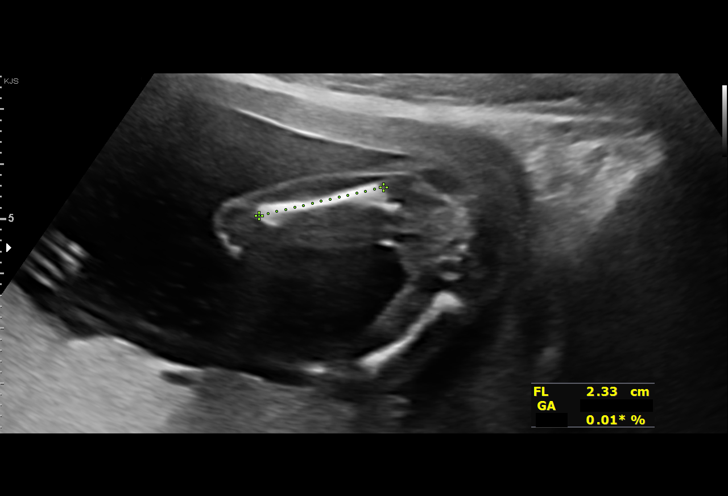
[im 71/74]
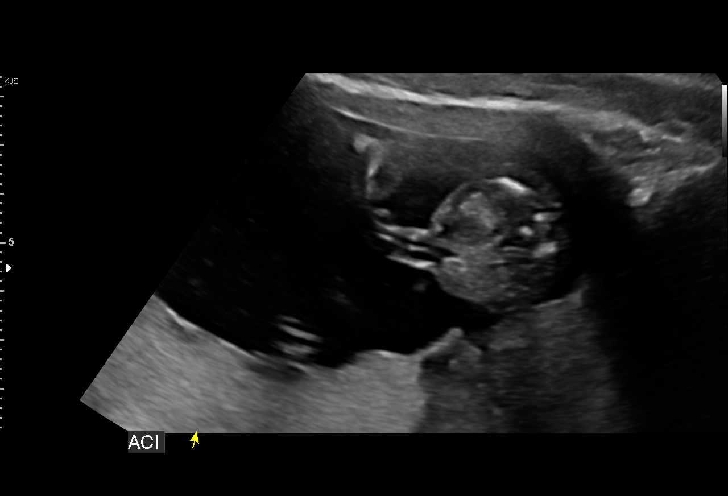

[13 of 28 positions shown; findings below may reference images not displayed]

1  US MFM OB COMP + 14 WK                76805.01    FIORALBA DUMA

Indications

 Obesity complicating pregnancy, second
 trimester (BMI 50)
 Encounter for antenatal screening for
 malformations
 17 weeks gestation of pregnancy
Fetal Evaluation

 Num Of Fetuses:         1
 Fetal Heart Rate(bpm):  145
 Cardiac Activity:       Observed
 Presentation:           Variable
 Placenta:               Posterior
 P. Cord Insertion:      Marginal insertion

 Amniotic Fluid
 AFI FV:      Within normal limits

                             Largest Pocket(cm)

Biometry

 BPD:      36.7  mm     G. Age:  17w 2d         34  %    CI:         66.7   %    70 - 86
                                                         FL/HC:      16.1   %    15.8 - 18
 HC:       144   mm     G. Age:  17w 4d         44  %    HC/AC:      1.15        1.07 -
 AC:       125   mm     G. Age:  18w 1d         67  %    FL/BPD:     63.2   %
 FL:       23.2  mm     G. Age:  17w 0d         23  %    FL/AC:      18.6   %    20 - 24
 HUM:      23.4  mm     G. Age:  17w 2d         46  %
 CER:      17.6  mm     G. Age:  17w 4d         51  %
 NFT:       1.9  mm

 LV:        6.8  mm
 CM:        4.9  mm
 Est. FW:     200  gm      0 lb 7 oz     44  %
Gestational Age

 LMP:           20w 5d        Date:  04/11/21                 EDD:   01/16/22
 U/S Today:     17w 4d                                        EDD:   02/07/22
 Best:          17w 4d     Det. By:  U/S (09/03/21)           EDD:   02/07/22
Anatomy

 Cranium:               Appears normal         Aortic Arch:            Appears normal
 Cavum:                 Appears normal         Ductal Arch:            Appears normal
 Ventricles:            Appears normal         Diaphragm:              Appears normal
 Choroid Plexus:        Appears normal         Stomach:                Appears normal, left
                                                                       sided
 Cerebellum:            Appears normal         Abdomen:                Appears normal
 Posterior Fossa:       Appears normal         Abdominal Wall:         Appears nml (cord
                                                                       insert, abd wall)
 Nuchal Fold:           Appears normal         Cord Vessels:           Appears normal (3
                                                                       vessel cord)
 Face:                  Orbits nl; profile not Kidneys:                Appear normal
                        well visualized
 Lips:                  Appears normal         Bladder:                Appears normal
 Thoracic:              Appears normal         Spine:                  Appears normal
 Heart:                 Appears normal         Upper Extremities:      Appears normal
                        (4CH, axis, and
                        situs)
 RVOT:                  Not well visualized    Lower Extremities:      Appears normal
 LVOT:                  Appears normal

 Other:  Fetus appears to be female. Technically difficult due to maternal
         habitus and early GA. Hands and feet visualized. Nasal bone, heels
         not well seen.
Targeted Anatomy

 Thorax
 3 Vessel View:         Appears normal
Impression

 Single intrauterine pregnancy here for a detailed anatomy
 due to maternal elevated BMI.
 Normal anatomy with measurements inconsistent with dates,
 therefore we have adjusted Ms. Steinwender Meilinger to today's
 examination.
 There is good fetal movement and amniotic fluid volume
 Suboptimal views of the fetal anatomy were obtained
 secondary to fetal position.

 Today we observed a marginal cord insertion that was 1.8 cm
 from the placental edge. I reviewed with Ms. Malling that a
 marginal cord insertion can be associated with small for
 gestational age. Therefore we have scheduled her to return in
 4 weeks.

 Given Ms. Malling elevated BMI we recommend serial growth
 every 4-6 weeks and to initiate weekly testing between 34-36
 weeks. Total weight gain should range between 11-20 lb.
Recommendations

 Follow up growth in 4 weeks to complete the fetal anatomy.

## 2023-05-15 IMAGING — US US MFM OB FOLLOW-UP
1 series · 14 of 27 positions shown · non-contrast
Comparison: none

[Series 1: us mfm ob follow-up · 14 of 27 slices shown]
[im 1/27]
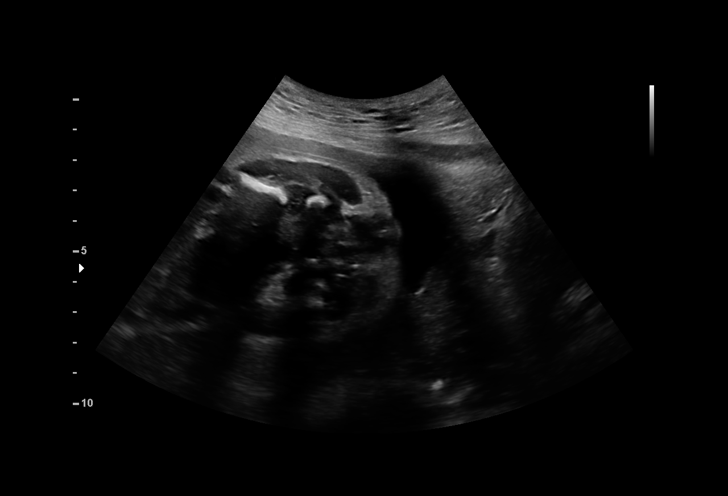
[im 3/27]
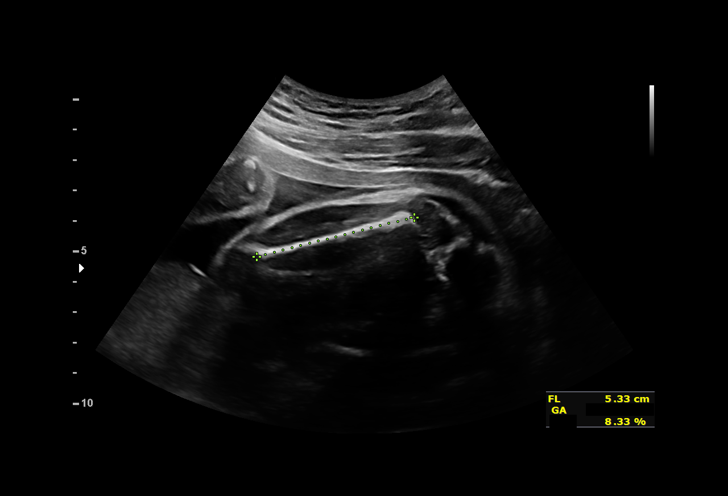
[im 5/27]
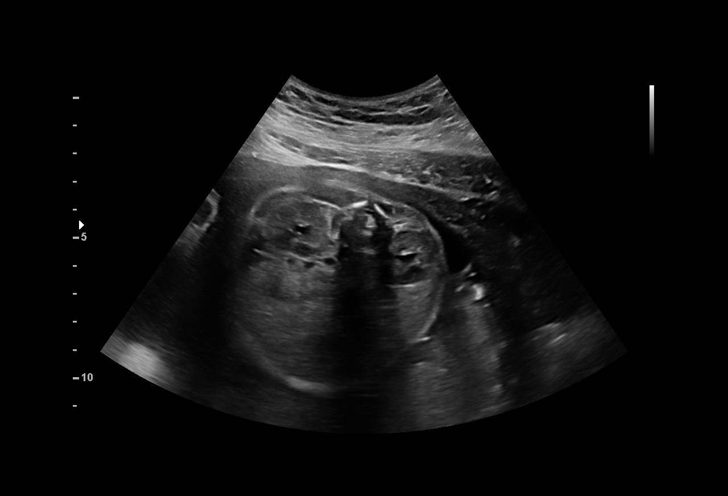
[im 7/27]
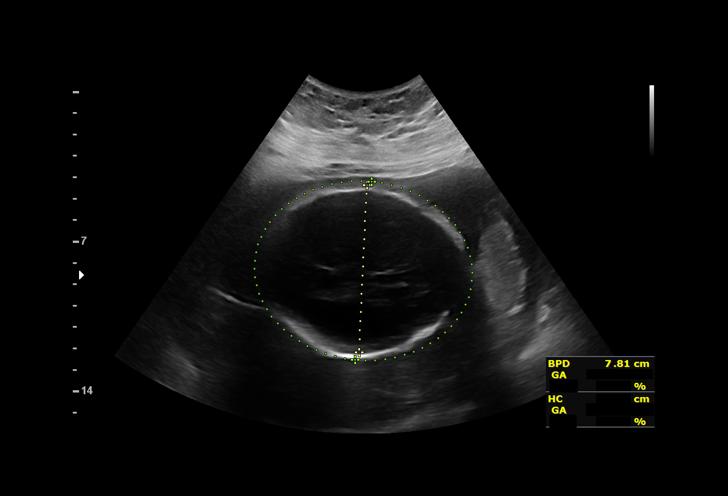
[im 9/27]
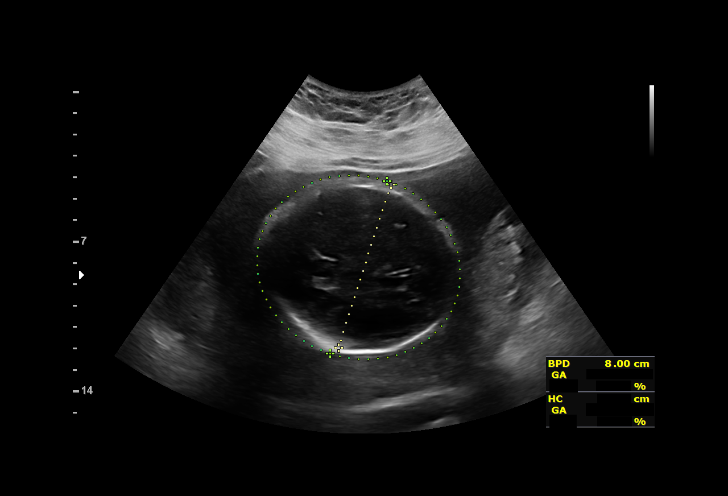
[im 11/27]
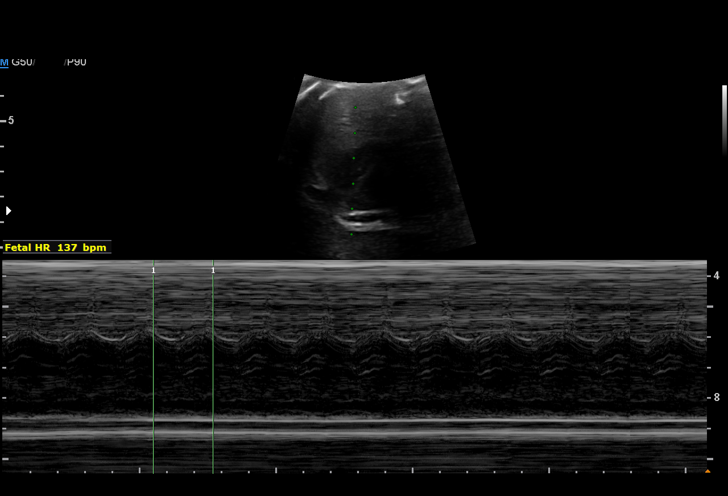
[im 13/27]
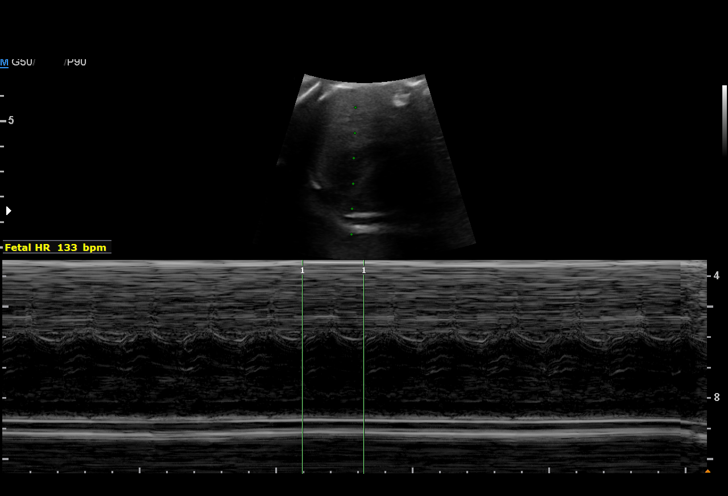
[im 15/27]
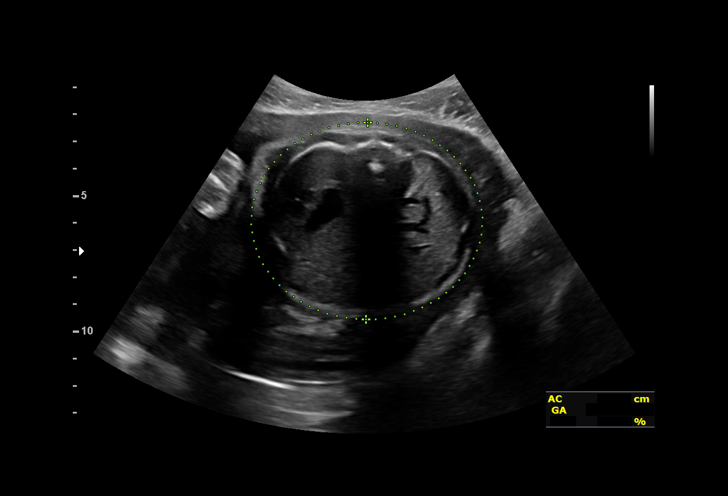
[im 17/27]
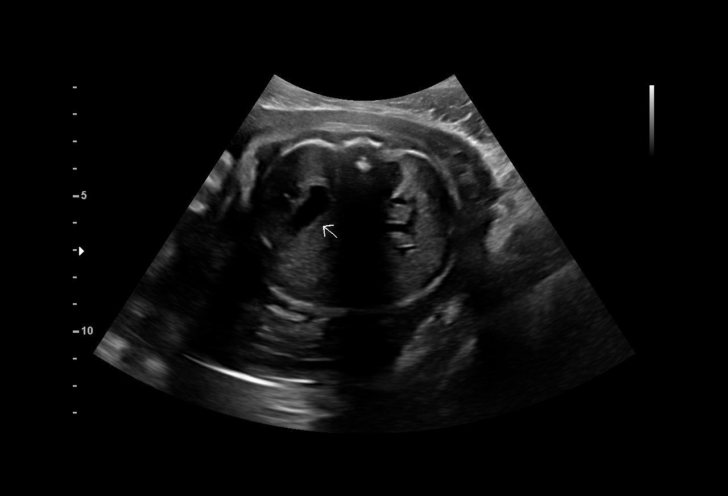
[im 19/27]
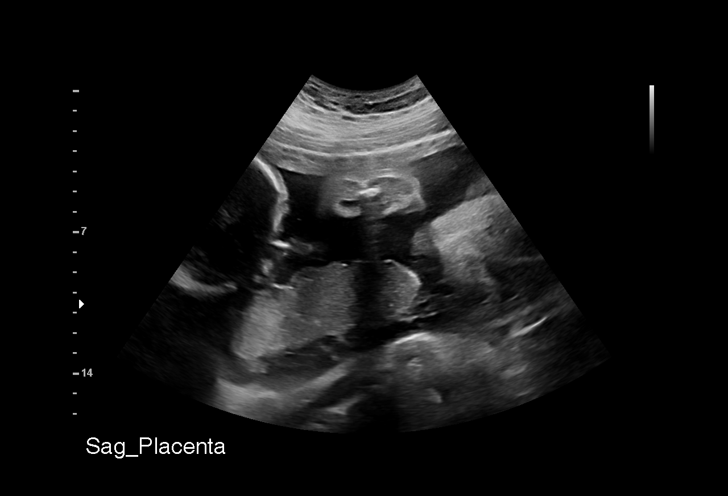
[im 21/27]
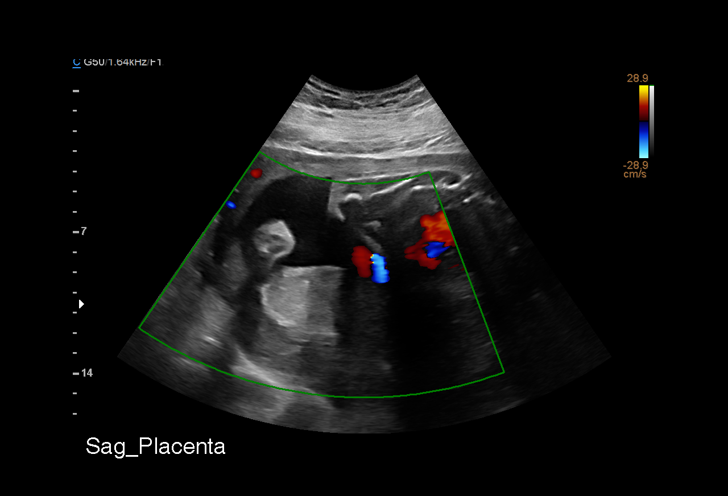
[im 23/27]
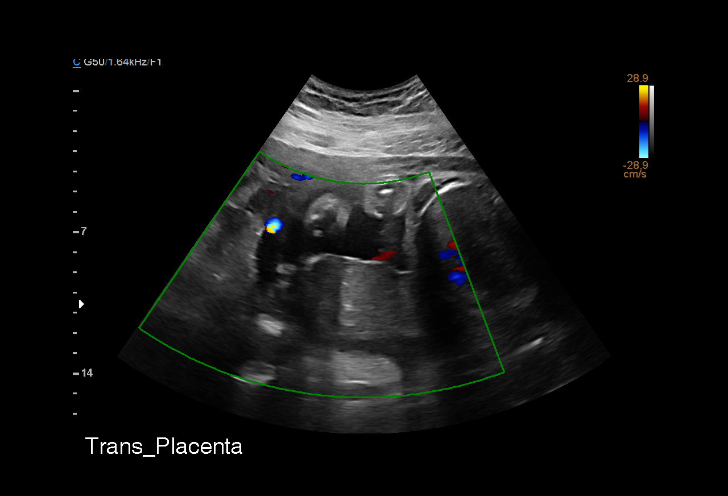
[im 25/27]
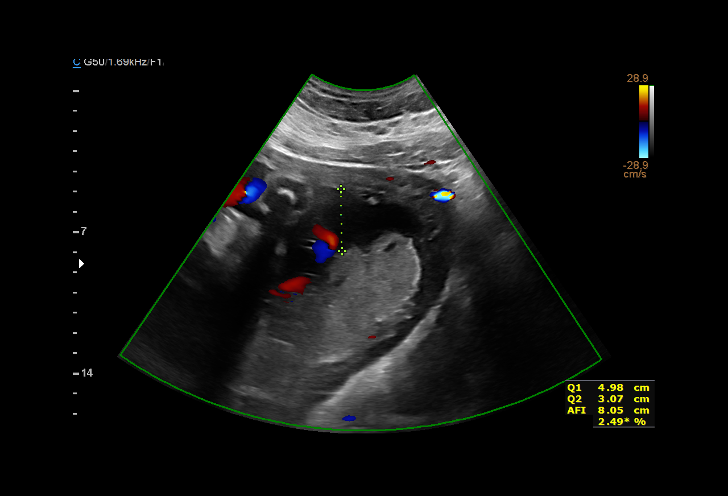
[im 27/27]
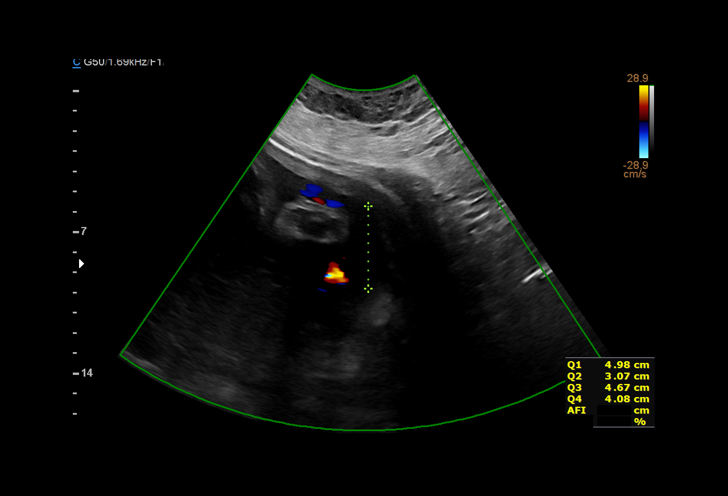

[14 of 27 positions shown; findings below may reference images not displayed]

HARE

Indications

 Obesity complicating pregnancy, second
 trimester (BMI 50)
 Encounter for other antenatal screening
 follow-up
 Marginal insertion of umbilical cord affecting
 management of mother in second trimester
 29 weeks gestation of pregnancy
Fetal Evaluation

 Num Of Fetuses:         1
 Fetal Heart Rate(bpm):  137
 Cardiac Activity:       Observed
 Presentation:           Breech
 Placenta:               Posterior
 P. Cord Insertion:      Marginal insertion prev seen

 Amniotic Fluid
 AFI FV:      Within normal limits

 AFI Sum(cm)     %Tile       Largest Pocket(cm)
 16.8            62

 RUQ(cm)       RLQ(cm)       LUQ(cm)        LLQ(cm)

Biometry

 BPD:      78.6  mm     G. Age:  31w 4d         90  %    CI:        74.34   %    70 - 86
                                                         FL/HC:      18.5   %    19.2 -
 HC:      289.4  mm     G. Age:  31w 6d         81  %    HC/AC:      1.17        0.99 -
 AC:      247.6  mm     G. Age:  29w 0d         27  %    FL/BPD:     68.2   %    71 - 87
 FL:       53.6  mm     G. Age:  28w 3d         10  %    FL/AC:      21.6   %    20 - 24

 Est. FW:    9179  gm           3 lb     26  %
Gestational Age

 LMP:           32w 5d        Date:  04/11/21                 EDD:   01/16/22
 U/S Today:     30w 2d                                        EDD:   02/02/22
 Best:          29w 4d     Det. By:  U/S  (09/03/21)          EDD:   02/07/22
Anatomy

 Cranium:               Appears normal         LVOT:                   Previously seen
 Cavum:                 Previously seen        Aortic Arch:            Previously seen
 Ventricles:            Previously seen        Ductal Arch:            Previously seen
 Choroid Plexus:        Previously seen        Diaphragm:              Appears normal
 Cerebellum:            Previously seen        Stomach:                Appears normal, left
                                                                       sided
 Posterior Fossa:       Previously seen        Abdomen:                Previously seen
 Nuchal Fold:           Not applicable (>20    Abdominal Wall:         Previously seen
                        wks GA)
 Face:                  Orbits and profile     Cord Vessels:           Previously seen
                        previously seen
 Lips:                  Previously seen        Kidneys:                Appear normal
 Palate:                Previously seen        Bladder:                Appears normal
 Thoracic:              Appears normal         Spine:                  Previously seen
 Heart:                 Previously seen        Upper Extremities:      Previously seen
 RVOT:                  Previously seen        Lower Extremities:      Previously seen

 Other:  Female gender previously seen. Technically difficult due to maternal
         habitus. Hands previously visualized. Heels/feet visualized. . Nasal
         bone, lenses, VC, 3VV and 3VTV visualized.
Impression

 Follow up growth due to elevated BMI and marginal cord
 insertion.
 Normal interval growth with measurements consistent with
 dates
 Good fetal movement and amniotic fluid volume
Recommendations

 Follow up growth in 4 weeks.

## 2023-06-24 IMAGING — US US MFM OB FOLLOW-UP
1 series · 14 of 28 positions shown · non-contrast
Comparison: none

[Series 1: us mfm ob follow-up · 44 acquisitions, 14 frames shown]
[im 2/44]
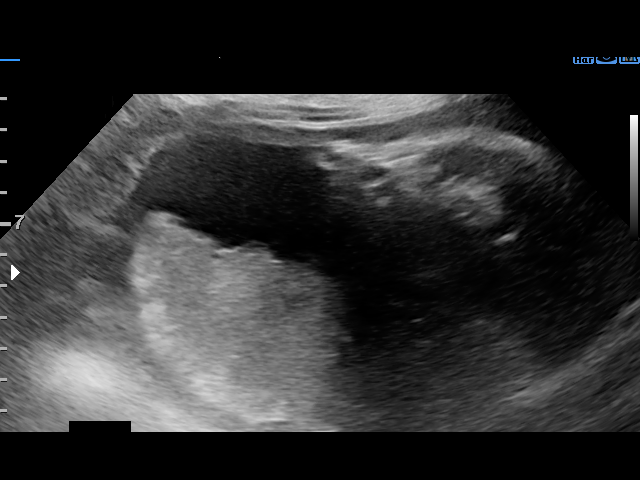
[im 5/44]
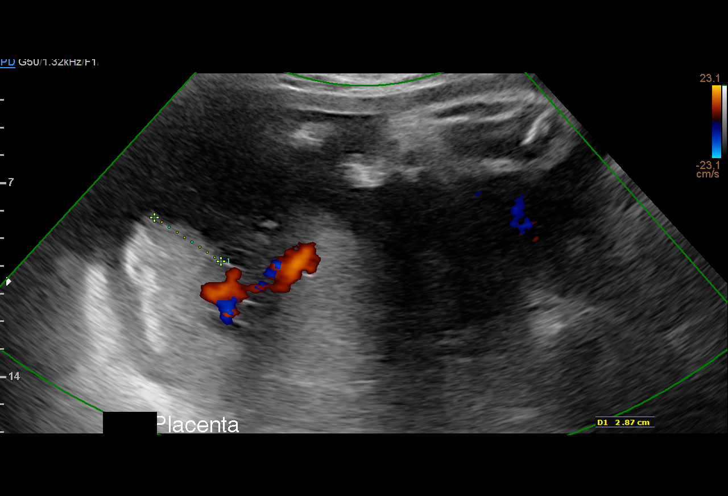
[im 8/44]
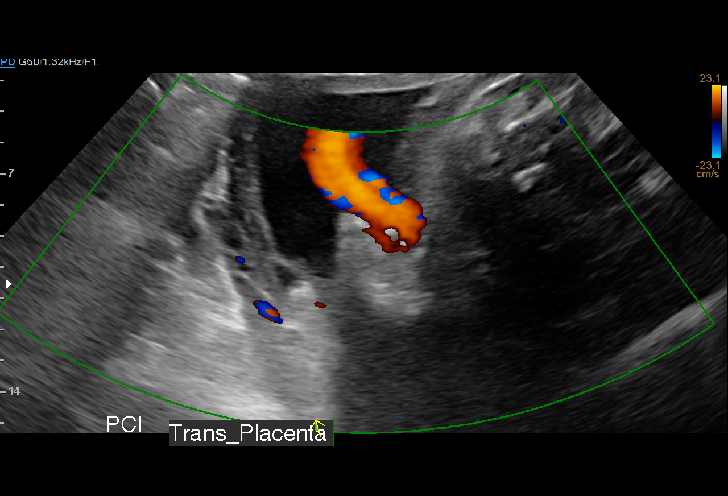
[im 12/44]
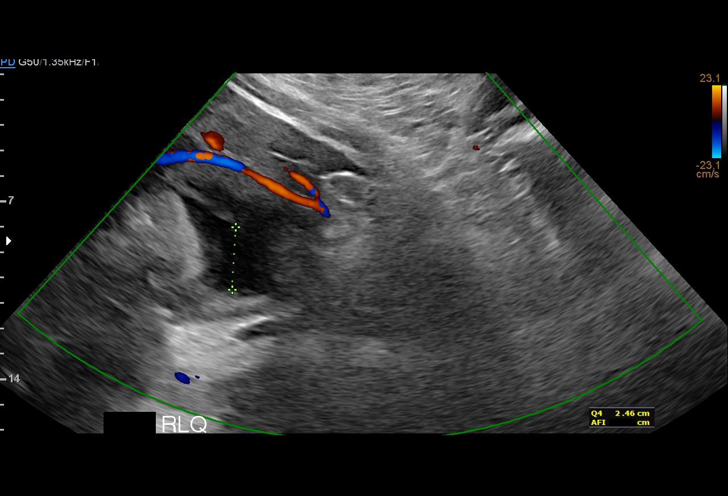
[im 15/44]
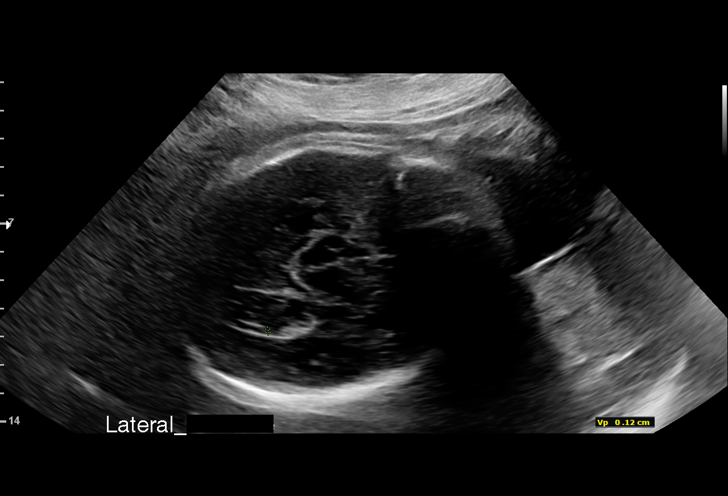
[im 18/44]
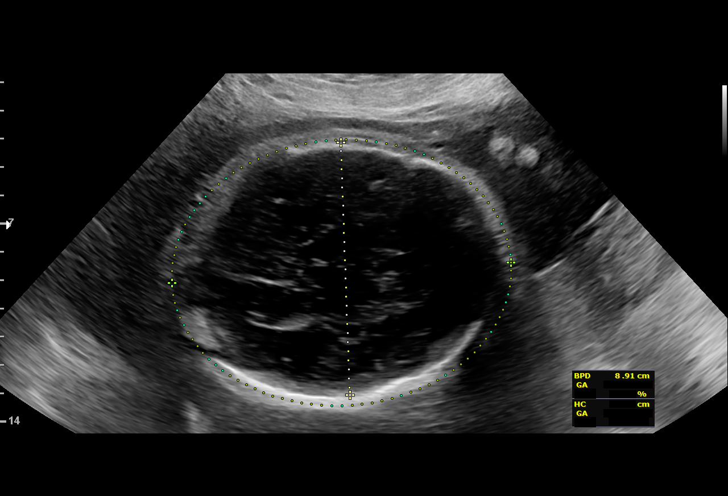
[im 21/44]
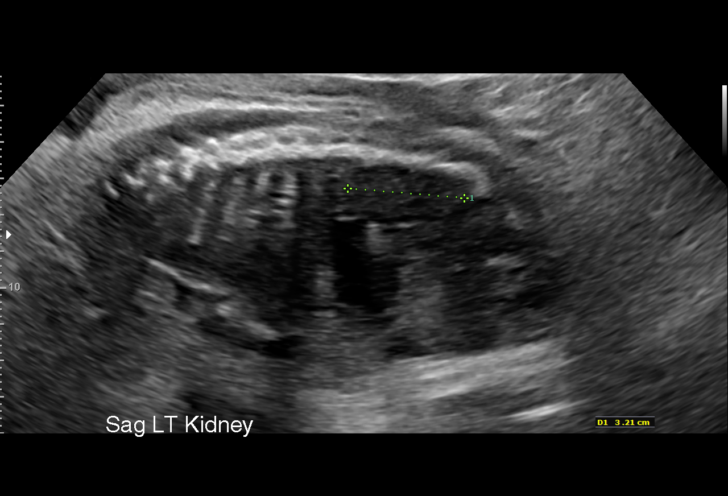
[im 24/44]
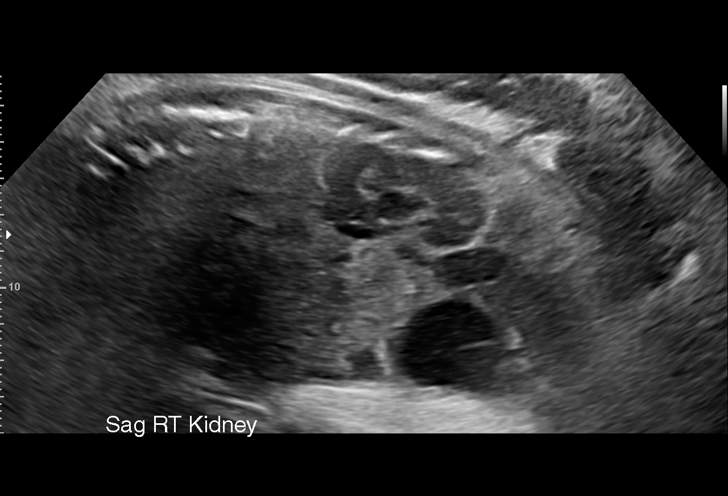
[im 28/44]
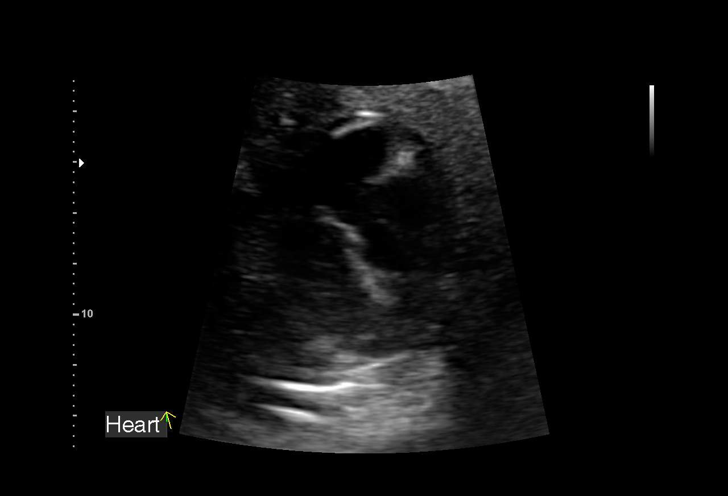
[im 31/44]
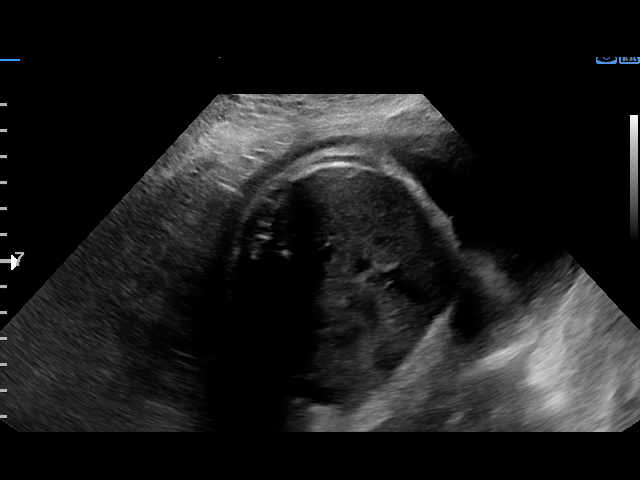
[im 34/44]
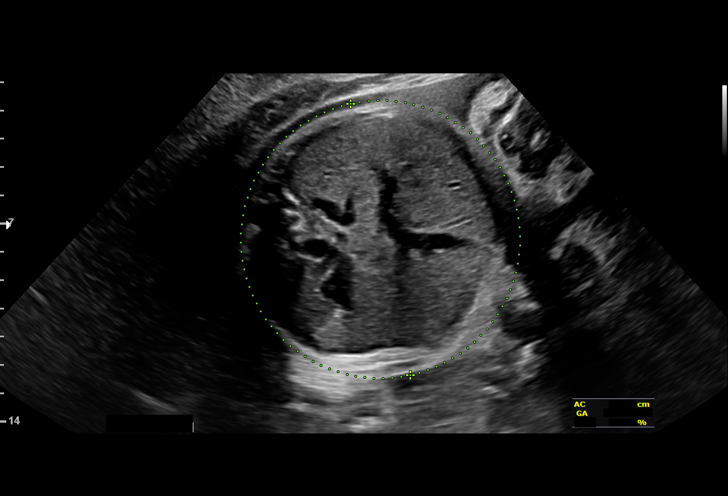
[im 37/44]
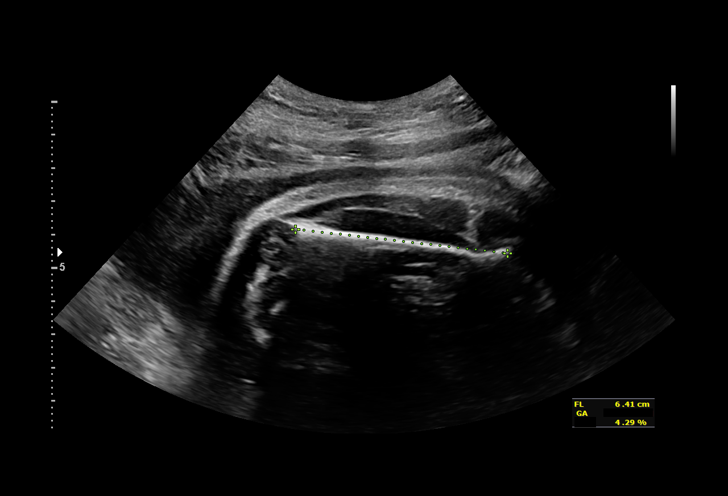
[im 40/44]
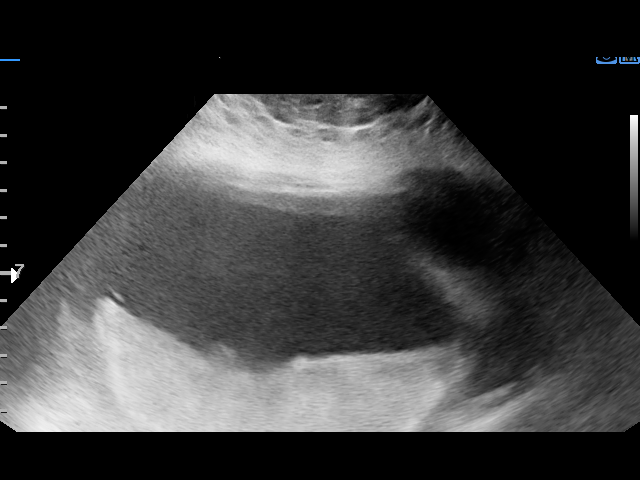
[im 44/44]
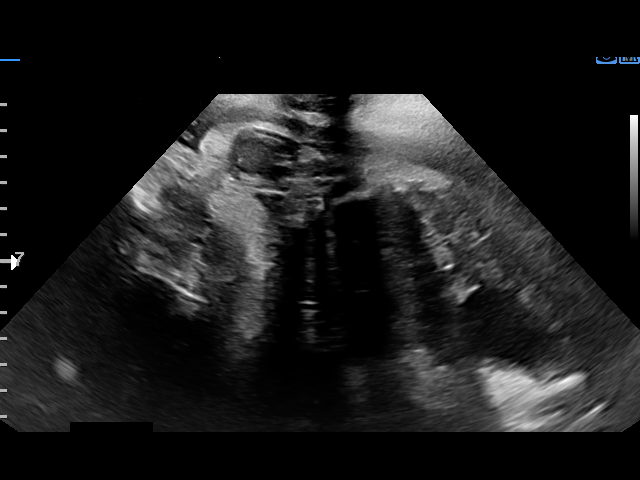

[14 of 28 positions shown; findings below may reference images not displayed]

MU

                                                      NUNES

Indications

 Marginal insertion of umbilical cord affecting
 management of mother in third trimester
 Obesity complicating pregnancy, third
 trimester (BMI 50)
 35 weeks gestation of pregnancy
Fetal Evaluation

 Num Of Fetuses:         1
 Fetal Heart Rate(bpm):  131
 Cardiac Activity:       Observed
 Presentation:           Breech
 Placenta:               Posterior
 P. Cord Insertion:      Marginal insertion

 Amniotic Fluid
 AFI FV:      Within normal limits

 AFI Sum(cm)     %Tile       Largest Pocket(cm)
 16.37           60

 RUQ(cm)       RLQ(cm)       LUQ(cm)        LLQ(cm)

Biophysical Evaluation
 Amniotic F.V:   Within normal limits       F. Tone:        Observed
 F. Movement:    Observed                   Score:          [DATE]
 F. Breathing:   Observed
Biometry

 BPD:      90.2  mm     G. Age:  36w 4d         85  %    CI:        72.55   %    70 - 86
                                                         FL/HC:      19.1   %    20.1 -
 HC:      336.8  mm     G. Age:  38w 4d         90  %    HC/AC:      1.09        0.93 -
 AC:      309.4  mm     G. Age:  34w 6d         46  %    FL/BPD:     71.3   %    71 - 87
 FL:       64.3  mm     G. Age:  33w 1d          5  %    FL/AC:      20.8   %    20 - 24

 LV:        1.2  mm

 Est. FW:    8336  gm    5 lb 10 oz      38  %
Gestational Age

 LMP:           38w 3d        Date:  04/11/21                 EDD:   01/16/22
 U/S Today:     35w 6d                                        EDD:   02/03/22
 Best:          35w 2d     Det. By:  U/S  (09/03/21)          EDD:   02/07/22
Anatomy

 Cranium:               Appears normal         LVOT:                   Previously seen
 Cavum:                 Previously seen        Aortic Arch:            Previously seen
 Ventricles:            Appears normal         Ductal Arch:            Previously seen
 Choroid Plexus:        Previously seen        Diaphragm:              Appears normal
 Cerebellum:            Previously seen        Stomach:                Appears normal, left
                                                                       sided
 Posterior Fossa:       Previously seen        Abdomen:                Previously seen
 Nuchal Fold:           Not applicable (>20    Abdominal Wall:         Previously seen
                        wks GA)
 Face:                  Orbits and profile     Cord Vessels:           Previously seen
                        previously seen
 Lips:                  Previously seen        Kidneys:                Appear normal
 Palate:                Previously seen        Bladder:                Appears normal
 Thoracic:              Appears normal         Spine:                  Previously seen
 Heart:                 Previously seen        Upper Extremities:      Previously seen
 RVOT:                  Previously seen        Lower Extremities:      Previously seen

 Other:  Female gender previously seen. Technically difficult due to maternal
         habitus. Hands previously visualized. Heels/feet prev visualized. .
         Nasal bone, lenses, VC, 3VV and 3VTV prev visualized.
Cervix Uterus Adnexa

 Cervix
 Not visualized (advanced GA >65wks)

 Uterus
 No abnormality visualized.

 Right Ovary
 Within normal limits.
 Left Ovary
 Within normal limits.

 Cul De Sac
 No free fluid seen.

 Adnexa
 No adnexal mass visualized.
Impression

 Prepregnancy BMI 50.  Marginal cord insertion.

 Fetal growth is appropriate for gestational age .Amniotic fluid
 is normal and good fetal activity is seen .Antenatal testing is
 reassuring. BPP [DATE].
                 Naylor, Carlina

## 2023-11-19 ENCOUNTER — Emergency Department (HOSPITAL_COMMUNITY)
Admission: EM | Admit: 2023-11-19 | Discharge: 2023-11-19 | Disposition: A | Discharge status: Home / Self Care | Payer: Medicaid Other | Attending: Emergency Medicine | Admitting: Emergency Medicine

## 2023-11-19 ENCOUNTER — Encounter: Payer: Self-pay | Admitting: Family Medicine

## 2023-11-19 ENCOUNTER — Other Ambulatory Visit: Payer: Self-pay

## 2023-11-19 DIAGNOSIS — Z3200 Encounter for pregnancy test, result unknown: Secondary | ICD-10-CM | POA: Diagnosis present

## 2023-11-19 DIAGNOSIS — Z3202 Encounter for pregnancy test, result negative: Secondary | ICD-10-CM | POA: Insufficient documentation

## 2023-11-19 LAB — HCG, SERUM, QUALITATIVE: Preg, Serum: NEGATIVE

## 2023-11-19 NOTE — Discharge Instructions (Signed)
Your pregnancy test was negative.  Please follow-up with your OB/GYN as needed.

## 2023-11-19 NOTE — ED Provider Notes (Signed)
  [place] [facility] AT [facility] Provider Note   CSN: [id number] Arrival date & time: 11/19/23  0032     History  Chief Complaint  Patient presents with   Pregnancy Test    Ashley Dillon is a 22 y.o. female.  Patient presents to the emergency department requesting a pregnancy test.  She states that she is 7 days late on her menstrual cycle.  She reportedly has an IUD in place.  She is currently breast-feeding an infant.  HPI     Home Medications Prior to Admission medications   Not on File      Allergies    Patient has no known allergies.    Review of Systems   Review of Systems  Physical Exam Updated Vital Signs BP (!) 142/73 (BP Location: Right Arm)   Pulse (!) 114   Temp 98.9 F (37.2 C) (Oral)   Resp 18   SpO2 100%  Physical Exam HENT:     Head: Normocephalic and atraumatic.  Pulmonary:     Effort: Pulmonary effort is normal. No respiratory distress.  Musculoskeletal:        General: No signs of injury.     Cervical back: Normal range of motion.  Skin:    General: Skin is dry.  Neurological:     Mental Status: She is alert.  Psychiatric:        Speech: Speech normal.        Behavior: Behavior normal.     ED Results / Procedures / Treatments   Labs (all labs ordered are listed, but only abnormal results are displayed) Labs Reviewed  HCG, SERUM, QUALITATIVE    EKG None  Radiology No results found.  Procedures Procedures    Medications Ordered in ED Medications - No data to display  ED Course/ Medical Decision Making/ A&P                                 Medical Decision Making Amount and/or Complexity of Data Reviewed Labs: ordered.   Patient presents requesting a pregnancy test.  Pregnancy test is negative.  No indication for further emergent workup.  Patient discharged home.        Final Clinical Impression(s) / ED Diagnoses Final diagnoses:  Negative pregnancy test    Rx / DC Orders ED  Discharge Orders     None         Pamala Duffel 11/19/23 2130    Palumbo, April, MD 11/20/23 [phone]

## 2023-11-19 NOTE — ED Triage Notes (Signed)
Patient requesting pregnancy test

## 2024-01-31 ENCOUNTER — Encounter: Payer: Medicaid Other | Admitting: Obstetrics and Gynecology

## 2024-05-07 ENCOUNTER — Telehealth: Admitting: Family

## 2024-05-07 DIAGNOSIS — R110 Nausea: Secondary | ICD-10-CM | POA: Diagnosis not present

## 2024-05-07 DIAGNOSIS — K2100 Gastro-esophageal reflux disease with esophagitis, without bleeding: Secondary | ICD-10-CM

## 2024-05-07 MED ORDER — ONDANSETRON 4 MG PO TBDP: 4.0000 mg | ORAL_TABLET | Freq: Three times a day (TID) | ORAL | 0 refills | Status: DC | PRN

## 2024-05-07 MED ORDER — OMEPRAZOLE 20 MG PO CPDR: 20.0000 mg | DELAYED_RELEASE_CAPSULE | Freq: Every day | ORAL | 0 refills | Status: DC

## 2024-05-07 NOTE — Progress Notes (Signed)
 Virtual Visit Consent   Ashley Dillon, you are scheduled for a virtual visit with a [facility] provider today. Just as with appointments in the office, your consent must be obtained to participate. Your consent will be active for this visit and any virtual visit you may have with one of our providers in the next 365 days. If you have a MyChart account, a copy of this consent can be sent to you electronically.  As this is a virtual visit, video technology does not allow for your provider to perform a traditional examination. This may limit your provider's ability to fully assess your condition. If your provider identifies any concerns that need to be evaluated in person or the need to arrange testing (such as labs, EKG, etc.), we will make arrangements to do so. Although advances in technology are sophisticated, we cannot ensure that it will always work on either your end or our end. If the connection with a video visit is poor, the visit may have to be switched to a telephone visit. With either a video or telephone visit, we are not always able to ensure that we have a secure connection.  By engaging in this virtual visit, you consent to the provision of healthcare and authorize for your insurance to be billed (if applicable) for the services provided during this visit. Depending on your insurance coverage, you may receive a charge related to this service.  I need to obtain your verbal consent now. Are you willing to proceed with your visit today? Jackqueline Aquilar has provided verbal consent on 05/07/2024 for a virtual visit (video or telephone). Tommas Fragmin, FNP  Date: 05/07/2024 4:12 PM   Virtual Visit via Video Note   I, Tommas Fragmin, connected with  Narda Fundora  ([id number], Mar 23, 2001) on 05/07/24 at  4:15 PM EDT by a video-enabled telemedicine application and verified that I am speaking with the correct person using two identifiers.  Location: Patient: Virtual Visit Location Patient:  Home Provider: Virtual Visit Location Provider: Home Office   I discussed the limitations of evaluation and management by telemedicine and the availability of in person appointments. The patient expressed understanding and agreed to proceed.    History of Present Illness: Ashley Dillon is a 23 y.o. who identifies as a female who was assigned female at birth, and is being seen today for nausea and vomiting that started two days ago. Reports having acid reflux in the morning.  Pt has follow up with her PCP later in the month.  HPI: Gastroesophageal Reflux She complains of belching, coughing, heartburn and nausea. She reports no early satiety. This is a new problem. The current episode started 1 to 4 weeks ago. The symptoms are aggravated by certain foods. Risk factors include obesity. She has tried nothing for the symptoms. The treatment provided mild relief.    Problems:  Patient Active Problem List   Diagnosis Date Noted   S/P primary low transverse C-section 01/27/2022   Breech presentation of fetus 01/26/2022   Breech presentation, no version 01/22/2022   Morbid obesity (HCC) 12/25/2021   Obesity complicating pregnancy, childbirth, or puerperium, antepartum 12/25/2021   Marginal insertion of umbilical cord affecting management of mother 11/19/2021   Supervision of high risk pregnancy, antepartum 08/13/2021    Allergies: No Known Allergies Medications:  Current Outpatient Medications:    omeprazole (PRILOSEC) 20 MG capsule, Take 1 capsule (20 mg total) by mouth daily., Disp: 90 capsule, Rfl: 0   ondansetron  (ZOFRAN -ODT) 4 MG disintegrating tablet, Take  1 tablet (4 mg total) by mouth every 8 (eight) hours as needed for nausea or vomiting., Disp: 20 tablet, Rfl: 0  Observations/Objective: Patient is well-developed, well-nourished in no acute distress.  Resting comfortably  at home.  Head is normocephalic, atraumatic.  No labored breathing.  Speech is clear and coherent with  logical content.  Patient is alert and oriented at baseline.    Assessment and Plan: 1. Gastroesophageal reflux disease with esophagitis without hemorrhage (Primary) - omeprazole (PRILOSEC) 20 MG capsule; Take 1 capsule (20 mg total) by mouth daily.  Dispense: 90 capsule; Refill: 0  2. Nausea - ondansetron  (ZOFRAN -ODT) 4 MG disintegrating tablet; Take 1 tablet (4 mg total) by mouth every 8 (eight) hours as needed for nausea or vomiting.  Dispense: 20 tablet; Refill: 0  Start Prilosec 20 mg  -Diet discussed- Avoid fried, spicy, citrus foods, caffeine and alcohol -Do not eat 2-3 hours before bedtime -Encouraged small frequent meals -Avoid NSAID's -Zofran  as needed  Follow up if symptoms   Follow Up Instructions: I discussed the assessment and treatment plan with the patient. The patient was provided an opportunity to ask questions and all were answered. The patient agreed with the plan and demonstrated an understanding of the instructions.  A copy of instructions were sent to the patient via MyChart unless otherwise noted below.     The patient was advised to call back or seek an in-person evaluation if the symptoms worsen or if the condition fails to improve as anticipated.    Tommas Fragmin, FNP

## 2024-05-08 ENCOUNTER — Encounter: Payer: Self-pay | Admitting: Psychology

## 2024-05-15 ENCOUNTER — Encounter: Admitting: Family

## 2024-05-15 NOTE — Progress Notes (Signed)
 Erroneous encounter-disregard

## 2024-06-13 ENCOUNTER — Ambulatory Visit: Admitting: Family

## 2024-06-29 ENCOUNTER — Ambulatory Visit

## 2024-07-27 ENCOUNTER — Ambulatory Visit

## 2024-07-28 ENCOUNTER — Ambulatory Visit

## 2024-09-04 ENCOUNTER — Ambulatory Visit

## 2024-10-18 ENCOUNTER — Ambulatory Visit: Payer: Self-pay

## 2024-10-18 VITALS — BP 109/87 | HR 91 | Temp 97.7°F | Resp 16 | Ht 63.0 in | Wt 354.0 lb

## 2024-10-18 DIAGNOSIS — G43709 Chronic migraine without aura, not intractable, without status migrainosus: Secondary | ICD-10-CM | POA: Diagnosis not present

## 2024-10-18 DIAGNOSIS — L309 Dermatitis, unspecified: Secondary | ICD-10-CM

## 2024-10-18 DIAGNOSIS — J302 Other seasonal allergic rhinitis: Secondary | ICD-10-CM | POA: Diagnosis not present

## 2024-10-18 DIAGNOSIS — R0602 Shortness of breath: Secondary | ICD-10-CM | POA: Diagnosis not present

## 2024-10-18 DIAGNOSIS — Z7689 Persons encountering health services in other specified circumstances: Secondary | ICD-10-CM

## 2024-10-18 MED ORDER — HYDROCORTISONE 1 % EX OINT: 1.0000 | TOPICAL_OINTMENT | Freq: Two times a day (BID) | CUTANEOUS | 0 refills | Status: AC

## 2024-10-18 MED ORDER — CETIRIZINE HCL 10 MG PO TABS: 10.0000 mg | ORAL_TABLET | Freq: Every day | ORAL | 11 refills | Status: AC

## 2024-10-18 MED ORDER — ALBUTEROL SULFATE HFA 108 (90 BASE) MCG/ACT IN AERS: 2.0000 | INHALATION_SPRAY | Freq: Four times a day (QID) | RESPIRATORY_TRACT | 2 refills | Status: AC | PRN

## 2024-10-18 NOTE — Progress Notes (Signed)
     Patient ID: Ashley Dillon, female    DOB: 01-27-2001  MRN: [id number]  CC: Establish Care (Patient would like to talk  about her eczema and possible having bronchitis  )   Subjective: Shir Bergman is a 23 y.o. female with no pertinent past medical history who presents to clinic to establish care.  Patient reports her main concerns is eczema, migraines and occasional shortness of breath.  Regarding eczema patient reports that she feels like it is getting worse, she uses daily moisturizer but still experiences skin dryness and itchiness.  Rash is present in her legs and arms.  Regarding her shortness of breath patient reports that they are more affected by seasonal allergies.  Has not been formally diagnosed with asthma but gets relief from using her mother's nebulizer.  Has not tried any medication any over-the-counter medication for allergies. Regarding her migraines patient reports at least 3-4 episodes a month, patient reports that they tend to last about 5 days.  Receives relief from use of NSAIDs. No Known Allergies  ROS: Review of Systems Negative except as stated above  PHYSICAL EXAM: BP 109/87   Pulse 91   Temp 97.7 F (36.5 C) (Oral)   Resp 16   Ht 5' 3 (1.6 m)   Wt (!) 354 lb (160.6 kg)   SpO2 91%   Breastfeeding No   BMI 62.71 kg/m   Physical Exam  General: well-appearing, no acute distress Cardiovascular: regular heart rate and rhythm, normal S1/S2, no murmurs, gallops, or rubs, peripheral pulses 2+ bilaterally Chest: no skeletal deformity, lungs clear to auscultation bilaterally, equal breath sounds bilaterally Musculoskeletal: normal gait Extremities: no peripheral edema  ASSESSMENT AND PLAN:  1. Encounter to establish care with new provider (Primary)  2. Chronic migraine without aura without status migrainosus, not intractable -Plan to address allergies and asthma symptoms before considering migraine medication.  3. Shortness of breath -  Ambulatory referral to Pulmonology for formal pulmonary function test - albuterol (VENTOLIN HFA) 108 (90 Base) MCG/ACT inhaler; Inhale 2 puffs into the lungs every 6 (six) hours as needed for wheezing or shortness of breath.  Dispense: 8 g; Refill: 2  4. Seasonal allergies - cetirizine  (ZYRTEC ) 10 MG tablet; Take 1 tablet (10 mg total) by mouth daily.  Dispense: 30 tablet; Refill: 11 - Consider addition of Singulair if symptoms do not improve 5. Eczema, unspecified type - hydrocortisone 1 % ointment; Apply 1 Application topically 2 (two) times daily.  Dispense: 56 g; Refill: 0    Patient was given the opportunity to ask questions.  Patient verbalized understanding of the plan and was able to repeat key elements of the plan.    Orders Placed This Encounter  Procedures   Ambulatory referral to Pulmonology     Requested Prescriptions   Signed Prescriptions Disp Refills   albuterol (VENTOLIN HFA) 108 (90 Base) MCG/ACT inhaler 8 g 2    Sig: Inhale 2 puffs into the lungs every 6 (six) hours as needed for wheezing or shortness of breath.   cetirizine  (ZYRTEC ) 10 MG tablet 30 tablet 11    Sig: Take 1 tablet (10 mg total) by mouth daily.   hydrocortisone 1 % ointment 56 g 0    Sig: Apply 1 Application topically 2 (two) times daily.    Return in about 1 month (around 11/17/2024) for physical, follow-up.  Sula Leavy Rode, PA-C

## 2024-11-24 ENCOUNTER — Encounter

## 2024-12-26 ENCOUNTER — Encounter
